# Patient Record
Sex: Female | Born: 1955 | Race: White | Hispanic: No | Marital: Married | Smoking: Never smoker
Health system: Southern US, Community
[De-identification: ages and names within clinical notes are randomized; demographics above are authoritative.]

## PROBLEM LIST (undated history)

## (undated) DIAGNOSIS — H269 Unspecified cataract: Secondary | ICD-10-CM

## (undated) DIAGNOSIS — N83209 Unspecified ovarian cyst, unspecified side: Secondary | ICD-10-CM

## (undated) DIAGNOSIS — C44711 Basal cell carcinoma of skin of unspecified lower limb, including hip: Secondary | ICD-10-CM

## (undated) DIAGNOSIS — I7300 Raynaud's syndrome without gangrene: Secondary | ICD-10-CM

## (undated) DIAGNOSIS — M19042 Primary osteoarthritis, left hand: Secondary | ICD-10-CM

## (undated) DIAGNOSIS — N912 Amenorrhea, unspecified: Secondary | ICD-10-CM

## (undated) DIAGNOSIS — I8392 Asymptomatic varicose veins of left lower extremity: Secondary | ICD-10-CM

## (undated) DIAGNOSIS — M779 Enthesopathy, unspecified: Secondary | ICD-10-CM

## (undated) DIAGNOSIS — M19041 Primary osteoarthritis, right hand: Secondary | ICD-10-CM

## (undated) DIAGNOSIS — C801 Malignant (primary) neoplasm, unspecified: Secondary | ICD-10-CM

## (undated) HISTORY — DX: Primary osteoarthritis, left hand: M19.042

## (undated) HISTORY — DX: Asymptomatic varicose veins of left lower extremity: I83.92

## (undated) HISTORY — PX: SCLEROTHERAPY: SHX6841

## (undated) HISTORY — PX: CATARACT EXTRACTION: SUR2

## (undated) HISTORY — PX: VEIN SURGERY: SHX48

## (undated) HISTORY — DX: Primary osteoarthritis, right hand: M19.041

## (undated) HISTORY — PX: EYE SURGERY: SHX253

## (undated) HISTORY — PX: BASAL CELL CARCINOMA EXCISION: SHX1214

## (undated) HISTORY — PX: FINGER SURGERY: SHX640

## (undated) HISTORY — PX: EXCISION NEUROMA: SHX6350

## (undated) HISTORY — PX: BARTHOLIN CYST MARSUPIALIZATION: SHX5383

## (undated) HISTORY — PX: OTHER SURGICAL HISTORY: SHX169

## (undated) HISTORY — DX: Unspecified ovarian cyst, unspecified side: N83.209

## (undated) HISTORY — PX: HERNIA REPAIR: SHX51

## (undated) HISTORY — PX: WISDOM TOOTH EXTRACTION: SHX21

## (undated) HISTORY — PX: THYROID LOBECTOMY: SHX420

---

## 2009-02-06 HISTORY — PX: OTHER SURGICAL HISTORY: SHX169

## 2011-03-06 LAB — HM DEXA SCAN: HM Dexa Scan: NORMAL

## 2013-12-12 ENCOUNTER — Ambulatory Visit: Payer: Self-pay | Admitting: Ophthalmology

## 2014-01-30 ENCOUNTER — Ambulatory Visit: Payer: Self-pay | Admitting: Ophthalmology

## 2014-02-07 ENCOUNTER — Ambulatory Visit: Payer: Self-pay | Admitting: Family Medicine

## 2014-09-02 NOTE — Op Note (Signed)
PATIENT NAME:  Mia Cruz, Mia Cruz MR#:  [id number] DATE OF BIRTH:  [birth year]  DATE OF PROCEDURE:  12/12/2013  PREOPERATIVE DIAGNOSIS: Cataract, left eye.   POSTOPERATIVE DIAGNOSIS: Cataract, left eye.   PROCEDURE PERFORMED: Extracapsular cataract extraction using phacoemulsification with placement of Alcon SN6CWS 18.5 diopter posterior chamber lens, serial number [id number].   ANESTHESIA: 4% lidocaine and 0.75% Marcaine a 50-50 mixture with 10 units/mL of HyoMax added, given as a peribulbar.   ANESTHESIOLOGIST: Dr. Boston Service.   COMPLICATIONS: None.   ESTIMATED BLOOD LOSS: Less than 1 mL.   DESCRIPTION OF PROCEDURE: The patient was brought to the operating room and given a peribulbar block.  The patient was then prepped and draped in the usual fashion.  The vertical rectus muscles were imbricated using 5-0 silk sutures.  These sutures were then clamped to the sterile drapes as bridle sutures.  A limbal peritomy was performed extending two clock hours and hemostasis was obtained with cautery.  A partial thickness scleral groove was made at the surgical limbus and dissected anteriorly in a lamellar dissection using an Alcon crescent knife.  The anterior chamber was entered supero-temporally with a Superblade and through the lamellar dissection with a 2.6 mm keratome.  DisCoVisc was used to replace the aqueous and a continuous tear capsulorrhexis was carried out.  Hydrodissection and hydrodelineation were carried out with balanced salt and a 27 gauge canula.  The nucleus was rotated to confirm the effectiveness of the hydrodissection.  Phacoemulsification was carried out using a divide-and-conquer technique.  Total ultrasound time was 1 minute 57 seconds with an average power of 27.8 percent. CDE 52.62. No suture was placed.   Irrigation/aspiration was used to remove the residual cortex.  DisCoVisc was used to inflate the capsule and the internal incision was enlarged to 3 mm with the crescent  knife.  The intraocular lens was folded and inserted into the capsular bag using the Alcon AcrySert delivery system.  Irrigation/aspiration was used to remove the residual DisCoVisc.  Miostat was injected into the anterior chamber through the paracentesis track to inflate the anterior chamber and induce miosis.  The wound was checked for leaks and none were found. The conjunctiva was closed with cautery and the bridle sutures were removed.  One-tenth of a milliliter of Vigamox containing 0.1 mg of drug was injected via the paracentesis tract. An eye shield was placed on the eye.  The patient was discharged to the recovery room in good condition.   ____________________________ Loura Back Dandra Velardi, MD sad:lt D: 12/12/2013 13:42:45 ET T: 12/12/2013 16:57:04 ET JOB#: [id number]  cc: Remo Lipps A. Masiya Claassen, MD, <Dictator> Martie Lee MD ELECTRONICALLY SIGNED 12/19/2013 13:03

## 2014-09-02 NOTE — Op Note (Signed)
PATIENT NAME:  Mia Cruz, Mia Cruz MR#:  [id number] DATE OF BIRTH:  [birth year]  DATE OF PROCEDURE:  01/30/2014  PREOPERATIVE DIAGNOSIS:  Cataract, right eye.   POSTOPERATIVE DIAGNOSIS:  Cataract, right eye.  PROCEDURE PERFORMED:  Extracapsular cataract extraction using phacoemulsification with placement of an Alcon SN6CWS, 18.5-diopter posterior chamber lens, serial [id number].  SURGEON:  Loura Back. Caliana Spires, MD  ASSISTANT:  None.  ANESTHESIA:  4% lidocaine and 0.75% Marcaine in a 50/50 mixture with 10 units/mL of Hylenex added, given as a peribulbar.   ANESTHESIOLOGIST:  Dr. Benjamine Mola  COMPLICATIONS:  None.  ESTIMATED BLOOD LOSS:  Less than 1 ml.  DESCRIPTION OF PROCEDURE:  The patient was brought to the operating room and given a peribulbar block.  The patient was then prepped and draped in the usual fashion.  The vertical rectus muscles were imbricated using 5-0 silk sutures.  These sutures were then clamped to the sterile drapes as bridle sutures.  A limbal peritomy was performed extending two clock hours and hemostasis was obtained with cautery.  A partial thickness scleral groove was made at the surgical limbus and dissected anteriorly in a lamellar dissection using an Alcon crescent knife.  The anterior chamber was entered superonasally with a Superblade and through the lamellar dissection with a 2.6 mm keratome.  DisCoVisc was used to replace the aqueous and a continuous tear capsulorrhexis was carried out.  Hydrodissection and hydrodelineation were carried out with balanced salt and a 27 gauge canula.  The nucleus was rotated to confirm the effectiveness of the hydrodissection.  Phacoemulsification was carried out using a divide-and-conquer technique.  Total ultrasound time was 1 minute and 44 seconds with an average power of 26.7 percent and CDE 48.66.  Irrigation/aspiration was used to remove the residual cortex.  DisCoVisc was used to inflate the capsule and the internal incision was  enlarged to 3 mm with the crescent knife.  The intraocular lens was folded and inserted into the capsular bag using the AcrySert delivery system. Irrigation/aspiration was used to remove the residual DisCoVisc.  Miostat was injected into the anterior chamber through the paracentesis track to inflate the anterior chamber and induce miosis. A tenth of a milliliter of Vigamox containing 0.1 mg of drug was injected via the paracentesis tract. The wound was checked for leaks and none were found. The conjunctiva was closed with cautery and the bridle sutures were removed.  Two drops of 0.3% Vigamox were placed on the eye.   An eye shield was placed on the eye.  The patient was discharged to the recovery room in good condition.   ____________________________ Loura Back Shamiah Kahler, MD sad:sb D: 01/30/2014 12:59:19 ET T: 01/30/2014 13:15:52 ET JOB#: [id number]  cc: Remo Lipps A. Bell Cai, MD, <Dictator> Martie Lee MD ELECTRONICALLY SIGNED 01/30/2014 14:00

## 2014-11-14 ENCOUNTER — Ambulatory Visit (INDEPENDENT_AMBULATORY_CARE_PROVIDER_SITE_OTHER): Payer: 59

## 2014-11-14 ENCOUNTER — Ambulatory Visit (INDEPENDENT_AMBULATORY_CARE_PROVIDER_SITE_OTHER): Payer: 59 | Admitting: Podiatry

## 2014-11-14 ENCOUNTER — Encounter: Payer: Self-pay | Admitting: Podiatry

## 2014-11-14 VITALS — BP 133/73 | HR 88 | Resp 16

## 2014-11-14 DIAGNOSIS — M7662 Achilles tendinitis, left leg: Secondary | ICD-10-CM

## 2014-11-14 DIAGNOSIS — M7661 Achilles tendinitis, right leg: Secondary | ICD-10-CM | POA: Diagnosis not present

## 2014-11-14 DIAGNOSIS — M79673 Pain in unspecified foot: Secondary | ICD-10-CM | POA: Diagnosis not present

## 2014-11-14 MED ORDER — METHYLPREDNISOLONE 4 MG PO TBPK: ORAL_TABLET | ORAL | Status: DC

## 2014-11-14 MED ORDER — DICLOFENAC SODIUM 1 % TD GEL: 4.0000 g | Freq: Four times a day (QID) | TRANSDERMAL | Status: DC

## 2014-11-14 NOTE — Progress Notes (Signed)
   Subjective:    Patient ID: Mia Cruz, female    DOB: Sep 02, 1955, 59 y.o.   MRN: [id number]  HPI Comments: "I have pain in the achilles tendon"  Patient c/o aching posterior heel/achilles tendon area bilateral, right over left, for several months. Pain AM. Swelling. Active hiker. Takes meloxicam for thumbs, wears orthotics. Getting worse.   Foot Pain Associated symptoms include arthralgias.      Review of Systems  Musculoskeletal: Positive for arthralgias.  All other systems reviewed and are negative.      Objective:   Physical Exam: I have reviewed his past mental history medications allergy surgery social history. Pulses are palpable neurologic sensorium is intact. Deep tendon reflex is intact. Muscle strength is normal bilateral. Orthopedic evaluation demonstrates all joints distal to the ankle for range of motion without crepitation. She has pain on palpation posterior aspect of the bilateral heel. Marginal pulsatile masses posterior aspect of the calcaneus indicative of retrocalcaneal heel spur. Radiograph confirms this with thickening of the Achilles tendon.        Assessment & Plan:  Assessment: Achilles tendinitis bilateral   Plan: Start her on a Medrol Dosepak and then continue with her meloxicam prescription. Dispensed night splint and discussed the possibility of orthotics. Follow up within 1 month.

## 2014-11-14 NOTE — Patient Instructions (Signed)

## 2014-11-30 ENCOUNTER — Ambulatory Visit: Payer: 59 | Admitting: Podiatry

## 2014-12-21 ENCOUNTER — Ambulatory Visit: Payer: 59 | Admitting: Podiatry

## 2014-12-27 HISTORY — PX: BARTHOLIN CYST MARSUPIALIZATION: SHX5383

## 2015-05-24 ENCOUNTER — Other Ambulatory Visit: Payer: Self-pay | Admitting: Obstetrics and Gynecology

## 2015-05-24 DIAGNOSIS — Z1231 Encounter for screening mammogram for malignant neoplasm of breast: Secondary | ICD-10-CM

## 2015-06-05 ENCOUNTER — Ambulatory Visit
Admission: RE | Admit: 2015-06-05 | Discharge: 2015-06-05 | Disposition: A | Discharge status: Home / Self Care | Payer: 59 | Source: Ambulatory Visit | Attending: Obstetrics and Gynecology | Admitting: Obstetrics and Gynecology

## 2015-06-05 DIAGNOSIS — Z1231 Encounter for screening mammogram for malignant neoplasm of breast: Secondary | ICD-10-CM | POA: Diagnosis not present

## 2015-06-05 HISTORY — DX: Malignant (primary) neoplasm, unspecified: C80.1

## 2016-07-03 ENCOUNTER — Other Ambulatory Visit: Payer: Self-pay | Admitting: Obstetrics and Gynecology

## 2016-07-03 DIAGNOSIS — Z1231 Encounter for screening mammogram for malignant neoplasm of breast: Secondary | ICD-10-CM

## 2016-07-30 ENCOUNTER — Ambulatory Visit
Admission: RE | Admit: 2016-07-30 | Discharge: 2016-07-30 | Disposition: A | Discharge status: Home / Self Care | Payer: 59 | Source: Ambulatory Visit | Attending: Obstetrics and Gynecology | Admitting: Obstetrics and Gynecology

## 2016-07-30 DIAGNOSIS — Z1231 Encounter for screening mammogram for malignant neoplasm of breast: Secondary | ICD-10-CM | POA: Diagnosis not present

## 2016-08-19 ENCOUNTER — Encounter: Payer: Self-pay | Admitting: *Deleted

## 2016-08-20 ENCOUNTER — Encounter: Payer: Self-pay | Admitting: *Deleted

## 2016-08-20 ENCOUNTER — Ambulatory Visit: Payer: 59 | Admitting: Anesthesiology

## 2016-08-20 ENCOUNTER — Encounter
Admission: RE | Disposition: A | Discharge status: Home / Self Care | Payer: Self-pay | Source: Ambulatory Visit | Attending: Unknown Physician Specialty

## 2016-08-20 ENCOUNTER — Ambulatory Visit
Admission: RE | Admit: 2016-08-20 | Discharge: 2016-08-20 | Disposition: A | Discharge status: Home / Self Care | Payer: 59 | Source: Ambulatory Visit | Attending: Unknown Physician Specialty | Admitting: Unknown Physician Specialty

## 2016-08-20 DIAGNOSIS — Z1211 Encounter for screening for malignant neoplasm of colon: Secondary | ICD-10-CM | POA: Insufficient documentation

## 2016-08-20 DIAGNOSIS — K640 First degree hemorrhoids: Secondary | ICD-10-CM | POA: Diagnosis not present

## 2016-08-20 DIAGNOSIS — Z79899 Other long term (current) drug therapy: Secondary | ICD-10-CM | POA: Diagnosis not present

## 2016-08-20 DIAGNOSIS — Z85828 Personal history of other malignant neoplasm of skin: Secondary | ICD-10-CM | POA: Diagnosis not present

## 2016-08-20 DIAGNOSIS — I7300 Raynaud's syndrome without gangrene: Secondary | ICD-10-CM | POA: Diagnosis not present

## 2016-08-20 DIAGNOSIS — D128 Benign neoplasm of rectum: Secondary | ICD-10-CM | POA: Insufficient documentation

## 2016-08-20 DIAGNOSIS — Z791 Long term (current) use of non-steroidal anti-inflammatories (NSAID): Secondary | ICD-10-CM | POA: Insufficient documentation

## 2016-08-20 HISTORY — DX: Amenorrhea, unspecified: N91.2

## 2016-08-20 HISTORY — DX: Raynaud's syndrome without gangrene: I73.00

## 2016-08-20 HISTORY — DX: Unspecified cataract: H26.9

## 2016-08-20 HISTORY — DX: Enthesopathy, unspecified: M77.9

## 2016-08-20 HISTORY — DX: Basal cell carcinoma of skin of unspecified lower limb, including hip: C44.711

## 2016-08-20 LAB — HM COLONOSCOPY

## 2016-08-20 SURGERY — COLONOSCOPY WITH PROPOFOL
Anesthesia: General

## 2016-08-20 MED ORDER — MIDAZOLAM HCL 5 MG/5ML IJ SOLN
INTRAMUSCULAR | Status: DC | PRN
  Administered 2016-08-20 (×2): 1 mg via INTRAVENOUS

## 2016-08-20 MED ORDER — FENTANYL CITRATE (PF) 100 MCG/2ML IJ SOLN
INTRAMUSCULAR | Status: DC | PRN
  Administered 2016-08-20 (×2): 50 ug via INTRAVENOUS

## 2016-08-20 MED ORDER — PROPOFOL 500 MG/50ML IV EMUL
INTRAVENOUS | Status: AC
  Filled 2016-08-20: qty 50

## 2016-08-20 MED ORDER — PHENYLEPHRINE HCL 10 MG/ML IJ SOLN
INTRAMUSCULAR | Status: DC | PRN
  Administered 2016-08-20: 100 ug via INTRAVENOUS

## 2016-08-20 MED ORDER — FENTANYL CITRATE (PF) 100 MCG/2ML IJ SOLN
INTRAMUSCULAR | Status: AC
Start: 2016-08-20 — End: 2016-08-20
  Filled 2016-08-20: qty 2

## 2016-08-20 MED ORDER — MIDAZOLAM HCL 2 MG/2ML IJ SOLN
INTRAMUSCULAR | Status: AC
  Filled 2016-08-20: qty 2

## 2016-08-20 MED ORDER — SODIUM CHLORIDE 0.9 % IV SOLN
INTRAVENOUS | Status: DC
  Administered 2016-08-20: 10:00:00 via INTRAVENOUS

## 2016-08-20 MED ORDER — PROPOFOL 500 MG/50ML IV EMUL
INTRAVENOUS | Status: DC | PRN
  Administered 2016-08-20: 150 ug/kg/min via INTRAVENOUS

## 2016-08-20 MED ORDER — PROPOFOL 10 MG/ML IV BOLUS
INTRAVENOUS | Status: DC | PRN
  Administered 2016-08-20: 100 mg via INTRAVENOUS

## 2016-08-20 MED ORDER — LIDOCAINE 2% (20 MG/ML) 5 ML SYRINGE
INTRAMUSCULAR | Status: DC | PRN
  Administered 2016-08-20: 30 mg via INTRAVENOUS

## 2016-08-20 NOTE — Op Note (Signed)
[facility] Gastroenterology Patient Name: Mia Cruz Procedure Date: 08/20/2016 9:45 AM MRN: [id number] Account #: [id number] Date of Birth: January 01, 1956 Admit Type: Outpatient Age: 61 Room: [facility] ENDO ROOM 3 Gender: Female Note Status: Finalized Procedure:            Colonoscopy Indications:          Screening for colorectal malignant neoplasm Providers:            Manya Silvas, MD Referring MD:         Irven Easterly. Kary Kos, MD (Referring MD) Medicines:            Propofol per Anesthesia Complications:        No immediate complications. Procedure:            Pre-Anesthesia Assessment:                       - After reviewing the risks and benefits, the patient                        was deemed in satisfactory condition to undergo the                        procedure.                       After obtaining informed consent, the colonoscope was                        passed under direct vision. Throughout the procedure,                        the patient's blood pressure, pulse, and oxygen                        saturations were monitored continuously. The                        Colonoscope was introduced through the anus and                        advanced to the the cecum, identified by appendiceal                        orifice and ileocecal valve. The colonoscopy was                        performed without difficulty. The patient tolerated the                        procedure well. The quality of the bowel preparation                        was excellent. Findings:      A small polyp was found in the rectum. The polyp was sessile. The polyp       was partly removed with a removed with a cold snare. A piece remained       and was removed with a jumbo forceps. Some bleeding occured so I applied       a clip to close the edges of the polypectomy site. Resection and  retrieval were complete.      Internal hemorrhoids were found during endoscopy. The hemorrhoids  were       medium-sized and Grade I (internal hemorrhoids that do not prolapse).      The remainder of the colon exam was normal, the colon was somewhat long       and redundant. Impression:           - One small polyp in the rectum, removed with a hot                        snare. Resected and retrieved.                       - Internal hemorrhoids. Recommendation:       - Await pathology results. The clip will slough off in                        1-2 weeks in your stool. Manya Silvas, MD 08/20/2016 10:42:13 AM This report has been signed electronically. Number of Addenda: 0 Note Initiated On: 08/20/2016 9:45 AM Scope Withdrawal Time: 0 hours 19 minutes 48 seconds  Total Procedure Duration: 0 hours 31 minutes 14 seconds       [facility]

## 2016-08-20 NOTE — Anesthesia Postprocedure Evaluation (Signed)
Anesthesia Post Note  Patient: Mia Cruz  Procedure(s) Performed: Procedure(s) (LRB): COLONOSCOPY WITH PROPOFOL (N/A)  Patient location during evaluation: Endoscopy Anesthesia Type: General Level of consciousness: awake and alert Pain management: pain level controlled Vital Signs Assessment: post-procedure vital signs reviewed and stable Respiratory status: spontaneous breathing, nonlabored ventilation, respiratory function stable and patient connected to nasal cannula oxygen Cardiovascular status: blood pressure returned to baseline and stable Postop Assessment: no signs of nausea or vomiting Anesthetic complications: no     Last Vitals:  Vitals:   08/20/16 1100 08/20/16 1110  BP: 102/66 123/76  Pulse: (!) 53 (!) 52  Resp: 14 18  Temp:  36.3 C    Last Pain:  Vitals:   08/20/16 1110  TempSrc:   PainSc: 0-No pain                 Martha Clan

## 2016-08-20 NOTE — Anesthesia Post-op Follow-up Note (Cosign Needed)
Anesthesia QCDR form completed.        

## 2016-08-20 NOTE — Anesthesia Preprocedure Evaluation (Signed)
Anesthesia Evaluation  Patient identified by MRN, date of birth, ID band Patient awake    Reviewed: Allergy & Precautions, H&P , NPO status , Patient's Chart, lab work & pertinent test results, reviewed documented beta blocker date and time   History of Anesthesia Complications Negative for: history of anesthetic complications  Airway Mallampati: I  TM Distance: >3 FB Neck ROM: full    Dental  (+) Caps   Pulmonary neg pulmonary ROS,           Cardiovascular Exercise Tolerance: Good negative cardio ROS       Neuro/Psych negative neurological ROS  negative psych ROS   GI/Hepatic negative GI ROS, Neg liver ROS,   Endo/Other  negative endocrine ROS  Renal/GU negative Renal ROS  negative genitourinary   Musculoskeletal   Abdominal   Peds  Hematology negative hematology ROS (+)   Anesthesia Other Findings Past Medical History: No date: Amenorrhea No date: BCC (basal cell carcinoma), leg No date: Cancer ([place])     Comment: skin No date: Cataract No date: Raynaud disease No date: Tendonitis   Reproductive/Obstetrics negative OB ROS                             Anesthesia Physical Anesthesia Plan  ASA: II  Anesthesia Plan: General   Post-op Pain Management:    Induction:   Airway Management Planned:   Additional Equipment:   Intra-op Plan:   Post-operative Plan:   Informed Consent: I have reviewed the patients History and Physical, chart, labs and discussed the procedure including the risks, benefits and alternatives for the proposed anesthesia with the patient or authorized representative who has indicated his/her understanding and acceptance.   Dental Advisory Given  Plan Discussed with: Anesthesiologist, CRNA and Surgeon  Anesthesia Plan Comments:         Anesthesia Quick Evaluation

## 2016-08-20 NOTE — H&P (Signed)
Primary Care Physician:  [place] Pink, MD Primary Gastroenterologist:  Dr. Vira Agar  Pre-Procedure History & Physical: HPI:  Mia Cruz is a 61 y.o. female is here for an colonoscopy.   Past Medical History:  Diagnosis Date  . Amenorrhea   . BCC (basal cell carcinoma), leg   . Cancer ([place])    skin  . Cataract   . Raynaud disease   . Tendonitis     Past Surgical History:  Procedure Laterality Date  . CATARACT EXTRACTION    . EXCISION NEUROMA    . EYE SURGERY    . HERNIA REPAIR    . marsupization    . THYROID LOBECTOMY    . VEIN SURGERY      Prior to Admission medications   Medication Sig Start Date End Date Taking? Authorizing Provider  Cholecalciferol (VITAMIN D PO) Take by mouth.   Yes Historical Provider, MD  GRAPE SEED EXTRACT PO Take by mouth.   Yes Historical Provider, MD  meloxicam (MOBIC) 7.5 MG tablet TAKE 1 TABLET (7.5 MG TOTAL) BY MOUTH ONCE DAILY. 11/05/14  Yes Historical Provider, MD  Multiple Vitamin (MULTIVITAMIN) capsule Take 1 capsule by mouth daily.   Yes Historical Provider, MD  valACYclovir (VALTREX) 1000 MG tablet Take 1,000 mg by mouth 2 (two) times daily.   Yes Historical Provider, MD  diclofenac sodium (VOLTAREN) 1 % GEL Apply 4 g topically 4 (four) times daily. Patient not taking: Reported on 08/20/2016 11/14/14   Max T Hyatt, DPM  glucosamine-chondroitin 500-400 MG tablet Take 1 tablet by mouth 3 (three) times daily.    Historical Provider, MD  methylPREDNISolone (MEDROL) 4 MG TBPK tablet Tapering 6 day dose pack Patient not taking: Reported on 08/20/2016 11/14/14   Max T Hyatt, DPM    Allergies as of 07/03/2016 - Review Complete 11/14/2014  Allergen Reaction Noted  . Penicillins Anaphylaxis 11/14/2014    Family History  Problem Relation Age of Onset  . Breast cancer Maternal Aunt     50's    Social History   Social History  . Marital status: Married    Spouse name: N/A  . Number of children: N/A  . Years of education: N/A    Occupational History  . Not on file.   Social History Main Topics  . Smoking status: Never Smoker  . Smokeless tobacco: Never Used  . Alcohol use 0.0 oz/week     Comment: wine  . Drug use: No  . Sexual activity: Not on file   Other Topics Concern  . Not on file   Social History Narrative  . No narrative on file    Review of Systems: See HPI, otherwise negative ROS  Physical Exam: BP 130/73   Pulse 79   Temp (!) 96.4 F (35.8 C) (Tympanic)   Resp 18   LMP 10/11/1998   SpO2 (!) 71%  General:   Alert,  pleasant and cooperative in NAD Head:  Normocephalic and atraumatic. Neck:  Supple; no masses or thyromegaly. Lungs:  Clear throughout to auscultation.    Heart:  Regular rate and rhythm. Abdomen:  Soft, nontender and nondistended. Normal bowel sounds, without guarding, and without rebound.   Neurologic:  Alert and  oriented x4;  grossly normal neurologically.  Impression/Plan: Mia Cruz is here for an colonoscopy to be performed for screening colonoscopy  Risks, benefits, limitations, and alternatives regarding  colonoscopy have been reviewed with the patient.  Questions have been answered.  All parties agreeable.   Krisna Omar,  MD  08/20/2016, 9:52 AM

## 2016-08-20 NOTE — Transfer of Care (Signed)
Immediate Anesthesia Transfer of Care Note  Patient: Mia Cruz  Procedure(s) Performed: Procedure(s): COLONOSCOPY WITH PROPOFOL (N/A)  Patient Location: PACU and Endoscopy Unit  Anesthesia Type:General  Level of Consciousness: awake, oriented and patient cooperative  Airway & Oxygen Therapy: Patient Spontanous Breathing and Patient connected to nasal cannula oxygen  Post-op Assessment: Report given to RN and Post -op Vital signs reviewed and stable  Post vital signs: Reviewed and stable  Last Vitals:  Vitals:   08/20/16 0906  BP: 130/73  Pulse: 79  Resp: 18  Temp: (!) 35.8 C    Last Pain:  Vitals:   08/20/16 0906  TempSrc: Tympanic         Complications: No apparent anesthesia complications

## 2016-08-21 LAB — SURGICAL PATHOLOGY

## 2017-05-22 ENCOUNTER — Encounter: Payer: Self-pay | Admitting: Occupational Therapy

## 2017-05-22 ENCOUNTER — Ambulatory Visit: Payer: 59 | Attending: Rheumatology | Admitting: Occupational Therapy

## 2017-05-22 ENCOUNTER — Other Ambulatory Visit: Payer: Self-pay

## 2017-05-22 DIAGNOSIS — M25641 Stiffness of right hand, not elsewhere classified: Secondary | ICD-10-CM | POA: Diagnosis present

## 2017-05-22 DIAGNOSIS — M25642 Stiffness of left hand, not elsewhere classified: Secondary | ICD-10-CM | POA: Diagnosis present

## 2017-05-22 DIAGNOSIS — M79641 Pain in right hand: Secondary | ICD-10-CM

## 2017-05-22 DIAGNOSIS — M79642 Pain in left hand: Secondary | ICD-10-CM | POA: Diagnosis present

## 2017-05-22 NOTE — Therapy (Signed)
[facility] [address] [place], [address] Phone: [phone]   Fax:  [phone]  Occupational Therapy Evaluation  Patient Details  Name: Mia Cruz MRN: [id number] Date of Birth: 02/13/1956 Referring Provider: Jefm Bryant    Encounter Date: 05/22/2017  OT End of Session - 05/22/17 2052    Visit Number  1    Number of Visits  3    Date for OT Re-Evaluation  07/03/17    OT Start Time  1105    OT Stop Time  1210    OT Time Calculation (min)  65 min    Activity Tolerance  Patient tolerated treatment well    Behavior During Therapy  [facility] for tasks assessed/performed       Past Medical History:  Diagnosis Date  . Amenorrhea   . BCC (basal cell carcinoma), leg   . Cancer ([place])    skin  . Cataract   . Raynaud disease   . Tendonitis     Past Surgical History:  Procedure Laterality Date  . CATARACT EXTRACTION    . EXCISION NEUROMA    . EYE SURGERY    . HERNIA REPAIR    . marsupization    . THYROID LOBECTOMY    . VEIN SURGERY      There were no vitals filed for this visit.  Subjective Assessment - 05/22/17 2042    Subjective   My hands been bothering me more and more - had injury to my R thumb about 8 yrs ago , and my L hand bothers me more than the R - pain over the fingers and harder time to do yoga, lift or carry things, type on computer, cut food , yard work , sew and quilt     Patient Stated Goals  I just do not my hands to get the worse , the pain , the joints, using them  - if you can tell me what to do to maintain my range and strength     Currently in Pain?  Yes    Pain Score  2     Pain Location  Hand    Pain Orientation  Right;Left    Pain Descriptors / Indicators  Aching    Pain Type  Chronic pain    Pain Onset  More than a month ago        [facility] OT Assessment - 05/22/17 0001      Assessment   Medical Diagnosis  bilateral hand pain , OA     Referring Provider  kernodle     Onset  Date/Surgical Date  -- gradually the last 2-3 yrs     Hand Dominance  Right      Home  Environment   Lives With  Spouse      Prior Function   Vocation  Full time employment RN     Leisure  R hand dominant, likes to quilt, sew, yard work , yoga, hike, tablet and bake       Strength   Right Hand Grip (lbs)  45 55 cmc neoprene splint     Right Hand Lateral Pinch  12 lbs    Right Hand 3 Point Pinch  14 lbs    Left Hand Grip (lbs)  45 58 with cmc neoprene splint     Left Hand Lateral Pinch  11 lbs    Left Hand 3 Point Pinch  15 lbs      Right Hand  AROM   R Thumb Radial ABduction/ADduction 0-55  55    R Thumb Palmar ABduction/ADduction 0-45  40    R Index  MCP 0-90  80 Degrees      Left Hand AROM   L Thumb Radial ADduction/ABduction 0-55  40    L Thumb Palmar ADduction/ABduction 0-45  40      Paraffin done to bilateral hands prior to review of HEP to decrease pain with great results for pain per pt   HEP review  Moist heat Tendon glides Opposition  Thumb PA and RA - keep thumb joints straight - no hyper extention at MP  Joint protection principles  AE   hand out provided   pt fitted with neoprene CMC splint - issue on for L hand - pt report has one for R but did not use it  Ed pt on using it and show that she had less pain and increase grip with it on                   OT Education - 05/22/17 2052    Education provided  Yes    Education Details  Findings ,HEP and joint protection - splint use     Person(s) Educated  Patient    Methods  Explanation;Demonstration;Tactile cues;Verbal cues;Handout    Comprehension  Verbal cues required;Returned demonstration;Verbalized understanding          OT Long Term Goals - 05/22/17 2058      OT LONG TERM GOAL #1   Title  Pain decrease by 15 points on PRWHE     Baseline  PRWHE for pain at eval 27/50     Time  3    Period  Weeks    Status  New    Target Date  06/12/17      OT LONG TERM GOAL #2   Title  Pt  report and demo 3 joint protection and AE to decrease pain and maintain ROM /strength in hands     Baseline  no knowledge - was no using her R cmc neoprene splint     Time  6    Period  Weeks    Status  New    Target Date  07/03/17            Plan - 05/22/17 2053    Clinical Impression Statement  Pt present at OT evaluation with bilateral hand pain - and OA - pt had old thumb injury to R thumb and then report that L hand is actually hurting more - deep ache over R thumb and fingers of L hand - pt show hyperextention of DIP and decrease DIP flexion during oppoistion in R hand more than L , only R 2nd digit MC decrease - thumb CMC ADD and pt compensate with hyper extention of MP and flexion of IP during loading act and hyper extention of MP during ABD - pt increase pain with use  - grip WFL - and with CMC neoeprene splint pt actually on higher range for her age - pt can beneifit from Education to marntain ROM , and strength - decrease pain and modifications to protect joints     Occupational performance deficits (Please refer to evaluation for details):  ADL's;IADL's;Play;Leisure;Work    Rehab Potential  Good    OT Frequency  1x / week every 3 wks     OT Duration  6 weeks 3 wks     OT Treatment/Interventions  Self-care/ADL training;DME and/or  AE instruction;Splinting;Therapeutic exercise;Paraffin;Manual Therapy    Plan  assess progress with joint protection , AE and HEP     Clinical Decision Making  Limited treatment options, no task modification necessary    OT Home Exercise Plan  see pt instruction     Consulted and Agree with Plan of Care  Patient       Patient will benefit from skilled therapeutic intervention in order to improve the following deficits and impairments:  Decreased knowledge of use of DME, Impaired flexibility, Pain, Decreased range of motion, Impaired UE functional use, Decreased strength  Visit Diagnosis: Pain in left hand - Plan: Ot plan of care cert/re-cert  Pain  in right hand - Plan: Ot plan of care cert/re-cert  Stiffness of left hand, not elsewhere classified - Plan: Ot plan of care cert/re-cert  Stiffness of right hand, not elsewhere classified - Plan: Ot plan of care cert/re-cert    Problem List There are no active problems to display for this patient.   Rosalyn Gess OTR/L,CLT 05/22/2017, 9:03 PM  [facility] [address], [place], [address] Phone: [phone]   Fax:  [phone]  Name: Mia Cruz MRN: [id number] Date of Birth: [birth year]

## 2017-05-22 NOTE — Patient Instructions (Signed)
Moist heat Tendon glides Opposition  Thumb PA and RA - keep thumb joints straight - no hyper extention at MP  Joint protection principles  AE   hand out provided   pt fitted with neoprene CMC splint - issue on for L hand - pt report has one for R but did not use it  Ed pt on using it and show that she had less pain and increase grip with it on

## 2017-07-20 ENCOUNTER — Other Ambulatory Visit: Payer: Self-pay | Admitting: Obstetrics and Gynecology

## 2017-07-20 DIAGNOSIS — Z1231 Encounter for screening mammogram for malignant neoplasm of breast: Secondary | ICD-10-CM

## 2017-07-20 LAB — HM PAP SMEAR

## 2017-07-31 ENCOUNTER — Ambulatory Visit
Admission: RE | Admit: 2017-07-31 | Discharge: 2017-07-31 | Disposition: A | Discharge status: Home / Self Care | Payer: 59 | Source: Ambulatory Visit | Attending: Obstetrics and Gynecology | Admitting: Obstetrics and Gynecology

## 2017-07-31 DIAGNOSIS — Z1231 Encounter for screening mammogram for malignant neoplasm of breast: Secondary | ICD-10-CM | POA: Diagnosis not present

## 2019-05-10 ENCOUNTER — Other Ambulatory Visit: Payer: Self-pay | Admitting: Family Medicine

## 2019-05-10 DIAGNOSIS — Z1231 Encounter for screening mammogram for malignant neoplasm of breast: Secondary | ICD-10-CM

## 2019-05-12 ENCOUNTER — Ambulatory Visit
Admission: RE | Admit: 2019-05-12 | Discharge: 2019-05-12 | Disposition: A | Discharge status: Home / Self Care | Payer: 59 | Source: Ambulatory Visit | Attending: Family Medicine | Admitting: Family Medicine

## 2019-05-12 DIAGNOSIS — Z1231 Encounter for screening mammogram for malignant neoplasm of breast: Secondary | ICD-10-CM | POA: Diagnosis not present

## 2019-05-12 LAB — HM MAMMOGRAPHY

## 2019-06-16 DIAGNOSIS — N83202 Unspecified ovarian cyst, left side: Secondary | ICD-10-CM | POA: Insufficient documentation

## 2019-06-16 DIAGNOSIS — Z8742 Personal history of other diseases of the female genital tract: Secondary | ICD-10-CM | POA: Insufficient documentation

## 2019-06-17 ENCOUNTER — Ambulatory Visit (INDEPENDENT_AMBULATORY_CARE_PROVIDER_SITE_OTHER): Payer: 59 | Admitting: Podiatry

## 2019-06-17 ENCOUNTER — Other Ambulatory Visit: Payer: Self-pay

## 2019-06-17 ENCOUNTER — Ambulatory Visit (INDEPENDENT_AMBULATORY_CARE_PROVIDER_SITE_OTHER): Payer: 59

## 2019-06-17 ENCOUNTER — Other Ambulatory Visit: Payer: Self-pay | Admitting: Podiatry

## 2019-06-17 DIAGNOSIS — M7672 Peroneal tendinitis, left leg: Secondary | ICD-10-CM

## 2019-06-17 DIAGNOSIS — M722 Plantar fascial fibromatosis: Secondary | ICD-10-CM

## 2019-06-17 DIAGNOSIS — G5761 Lesion of plantar nerve, right lower limb: Secondary | ICD-10-CM | POA: Diagnosis not present

## 2019-06-20 NOTE — Progress Notes (Signed)
   Subjective: 64 y.o. female presenting today as a new patient with a chief complaint of aching pain to the lateral aspects and arches of the bilateral feel that began about four months ago. She states she walks about three miles daily which makes the pain worse. She has been using heat therapy, has been taking Meloxicam and wearing orthotics (states they are 64 years old) for treatment. Patient is here for further evaluation and treatment.  Past Medical History:  Diagnosis Date  . Amenorrhea   . BCC (basal cell carcinoma), leg   . Cancer ([place])    skin  . Cataract   . Raynaud disease   . Tendonitis      Objective: Physical Exam General: The patient is alert and oriented x3 in no acute distress.  Dermatology: Skin is warm, dry and supple bilateral lower extremities. Negative for open lesions or macerations bilateral.   Vascular: Dorsalis Pedis and Posterior Tibial pulses palpable bilateral.  Capillary fill time is immediate to all digits.  Neurological: Epicritic and protective threshold intact bilateral.   Musculoskeletal: Tenderness to palpation to the plantar aspect of the bilateral heels along the plantar fascia as well as the insertion of the peroneal tendon sheath of the left foot. Sharp pain with palpation of the 4th right interspace and lateral compression of the metatarsal heads consistent with neuroma.  Positive Conley Canal sign with loadbearing of the forefoot. All other joints range of motion within normal limits bilateral. Strength 5/5 in all groups bilateral.   Radiographic exam: Normal osseous mineralization. Joint spaces preserved. No fracture/dislocation/boney destruction. No other soft tissue abnormalities or radiopaque foreign bodies.   Assessment: 1. plantar fasciitis bilateral feet 2. Insertional peroneal tendinitis left  3. Morton's Neuroma right 4th interspace   Plan of Care:  1. Patient evaluated. Xrays reviewed.   2. Appointment with Liliane Channel, Pedorthist, for  custom molded orthotics.  3. Increase Meloxicam to 15 mg daily.  4. Return to clinic as needed.     Edrick Kins, DPM [facility]  Dr. Edrick Kins, DPM    [address], [place] [address]                Office [phone]  Fax [phone]

## 2019-07-05 ENCOUNTER — Other Ambulatory Visit: Payer: Self-pay

## 2019-07-05 ENCOUNTER — Ambulatory Visit: Payer: 59 | Admitting: Orthotics

## 2019-07-05 DIAGNOSIS — M722 Plantar fascial fibromatosis: Secondary | ICD-10-CM

## 2019-07-05 DIAGNOSIS — M7672 Peroneal tendinitis, left leg: Secondary | ICD-10-CM

## 2019-07-05 DIAGNOSIS — G5761 Lesion of plantar nerve, right lower limb: Secondary | ICD-10-CM

## 2019-07-05 NOTE — Progress Notes (Signed)
Patient came in today to pick up custom made foot orthotics.  The goals were accomplished and the patient reported no dissatisfaction with said orthotics.  Patient was advised of breakin period and how to report any issues. 

## 2019-07-21 ENCOUNTER — Encounter: Payer: Self-pay | Admitting: Podiatry

## 2020-01-26 LAB — HEPATIC FUNCTION PANEL
ALT: 23 (ref 7–35)
AST: 26 (ref 13–35)
Alkaline Phosphatase: 66 (ref 25–125)
Bilirubin, Total: 0.6

## 2020-01-26 LAB — CBC AND DIFFERENTIAL
HCT: 39 (ref 36–46)
Hemoglobin: 12.7 (ref 12.0–16.0)
Platelets: 253 (ref 150–399)
WBC: 3.1

## 2020-01-26 LAB — CBC: RBC: 3.96 (ref 3.87–5.11)

## 2020-01-26 LAB — BASIC METABOLIC PANEL
BUN: 11 (ref 4–21)
CO2: 31 — AB (ref 13–22)
Chloride: 103 (ref 99–108)
Creatinine: 0.7 (ref 0.5–1.1)
Glucose: 76
Potassium: 4.3 (ref 3.4–5.3)
Sodium: 137 (ref 137–147)

## 2020-01-26 LAB — COMPREHENSIVE METABOLIC PANEL: Calcium: 9.1 (ref 8.7–10.7)

## 2020-03-19 ENCOUNTER — Other Ambulatory Visit: Payer: Self-pay

## 2020-03-19 ENCOUNTER — Encounter: Payer: Self-pay | Admitting: Internal Medicine

## 2020-03-19 ENCOUNTER — Ambulatory Visit (INDEPENDENT_AMBULATORY_CARE_PROVIDER_SITE_OTHER): Payer: Medicare Other | Admitting: Internal Medicine

## 2020-03-19 VITALS — BP 136/70 | HR 65 | Temp 97.7°F | Resp 16 | Ht 67.0 in | Wt 127.0 lb

## 2020-03-19 DIAGNOSIS — M1990 Unspecified osteoarthritis, unspecified site: Secondary | ICD-10-CM

## 2020-03-19 DIAGNOSIS — I83812 Varicose veins of left lower extremities with pain: Secondary | ICD-10-CM | POA: Diagnosis not present

## 2020-03-19 DIAGNOSIS — D128 Benign neoplasm of rectum: Secondary | ICD-10-CM

## 2020-03-19 DIAGNOSIS — R3915 Urgency of urination: Secondary | ICD-10-CM | POA: Insufficient documentation

## 2020-03-19 DIAGNOSIS — E890 Postprocedural hypothyroidism: Secondary | ICD-10-CM

## 2020-03-19 DIAGNOSIS — Z1159 Encounter for screening for other viral diseases: Secondary | ICD-10-CM

## 2020-03-19 DIAGNOSIS — N83202 Unspecified ovarian cyst, left side: Secondary | ICD-10-CM | POA: Diagnosis not present

## 2020-03-19 DIAGNOSIS — I7300 Raynaud's syndrome without gangrene: Secondary | ICD-10-CM

## 2020-03-19 NOTE — Progress Notes (Signed)
Provider:  Rexene Edison. Mariea Clonts, D.O., C.M.D. Location:   [place]   Place of Service:   clinic  Previous PCP: Gayland Curry, DO Patient Care Team: Gayland Curry, DO as PCP - General (Geriatric Medicine) Edrick Kins, DPM as Consulting Physician (Podiatry) Benjaman Kindler, MD as Consulting Physician (Obstetrics and Gynecology) Emmaline Kluver., MD (Rheumatology) Oneta Rack, MD (Dermatology)  Extended Emergency Contact Information Primary Emergency Contact: Geist,Guy Address: [address] [place], [place] [address] [place] Phone: [phone] Relation: Spouse  Code Status: full code Goals of Care: Advanced Directive information Advanced Directives 03/19/2020  Does Patient Have a Medical Advance Directive? Yes  Type of Advance Directive [place]  Does patient want to make changes to medical advance directive? No - Patient declined  Copy of [place] in Chart? -   Chief Complaint  Patient presents with  . Establish Care    New patient to establish care.... concerns are joint pain, history of  rheumatoid arthritis and she's not sure she is getting enough protein . Possible UTI    . Health Maintenance    hepatitis C screening, PNA  and HIV screening     HPI: Patient is a 64 y.o. female seen today to establish with [facility].  Records have been requested from Dr. Maryland Pink.    She is from [place], moved here 6 yrs ago.  They moved here to be with her husband's mother.  She's always been really fit.    Her daughter is a resident after DO training.  She herself is not a pill taker.    She went to rheumatology at [facility] 6 yrs ago.  She had hurt her thumb and the other joints had also gotten worse.  Within a year, the left started changing.  RF was just slightlly above normal.  Hydroxychloroquine gave her headaches.  He was not sure if had RA. She read an article about gout  from low estrogen.    She used to be a running which she gave up 25-30 yrs ago--does power walking up hills and hiking.  In last 3 months, great toes of both feet bothersome.  Maybe due to orthotics.  Went to [facility] and got specialty orthotics there.   Wants to stay active-they just bought an RV and want to travel.  She is having extreme urgency when she has to go to the bathroom.  Has 3 kids and has always had a little.  Worse in the last 1.5 wks.  It just came on.  She's going through 2-3 pads per day. Burning leading up to going.  No suprapubic tenderness or flank pain.  No change in urine character.  Does hydrate well.    Has bad Raynaud's--painful even reaching into the freezer.  In the 72s in [place] she'd get it.  Even in the 50s here, it's bad.    She's had a left ovarian cyst--fairly large--when she first saw gyn here, she was alarmed, but it has not grown much.  Dr Leafy Ro.  Does not have a gyn or NP at this time  07/31/16 was last [place].  It was $500 to get it done.  She has a high pain threshold and never really had much cramps or symptoms down there.  Bowels move great.  No bloating.  Wt 127 lbs, but usually has been about 120.  BP is normally in the 120s.    She has been having some pain in her left buttock if they travel for a while--burns and goes to knee.  Wonders about stretches or exercises to do to help with this.  Has very tight hamstrings.   Varicose veins--had sclerotherapy done.  Painful in the summer behind her left knee.  Feels bruised and black and blue.  Lives with those.   Started during a pregnancy.  Had biopsy on right shoulder.  Had too many keratoses burned off.  Sees derm:  Dr. Cy Blamer.    Unsure about tdap--one in 2009.    Her parents both had dementia--dad had "organic brain disease"--was electrical engineer--sounds like frontotemporal dementia.  Mom had dementia related to PD.  She's a retired Therapist, sports.  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  Did med surg, intensive care peds.    Her husband is an Art gallery manager.    Has tinnitus also.   Wakes up at times with a "fuzzy feeling" in both hands.  Not tingly.    Past Medical History:  Diagnosis Date  . Amenorrhea   . BCC (basal cell carcinoma), leg   . Cancer ([place])    skin  . Cataract   . Osteoarthritis of hands, bilateral   . Ovarian cyst   . Raynaud disease   . Tendonitis   . Varicose veins of left lower extremity    Past Surgical History:  Procedure Laterality Date  . BARTHOLIN CYST MARSUPIALIZATION    . BASAL CELL CARCINOMA EXCISION     Left Side of Face, Two on Back, Left just above upper Lip  . CATARACT EXTRACTION     Left Eye and Right Eye with implants  . EXCISION NEUROMA    . EYE SURGERY    . FINGER SURGERY     Surgical release of adhesion of right little finger  . hemithyroidectomy    . HERNIA REPAIR    . marsupization    . morton's neuroma/ neurectomy    . SCLEROTHERAPY    . THYROID LOBECTOMY    . VEIN SURGERY     Left and Right legs  . WISDOM TOOTH EXTRACTION      Social History   Socioeconomic History  . Marital status: Married    Spouse name: Not on file  . Number of children: Not on file  . Years of education: Not on file  . Highest education level: Not on file  Occupational History  . Not on file  Tobacco Use  . Smoking status: Never Smoker  . Smokeless tobacco: Never Used  Substance and Sexual Activity  . Alcohol use: Yes    Alcohol/week: 0.0 standard drinks    Comment: 5 Glasses of Wine  . Drug use: No  . Sexual activity: Not Currently  Other Topics Concern  . Not on file  Social History Narrative   Diet:  General/ Heart Healthy       Do you drink/ eat things with caffeine?  1-2 cups coffee in am (usually 1/2 caff strength)       Marital status:  Married                             What year were you married ? 1981      Do you live in a house, apartment,assistred living, condo,  trailer, etc.)? House  Is it one or more stories?  2 Story      How many persons live in your home ? 2      Do you have any pets in your home ?(please list) None      Highest Level of education completed: BSN / [employer]       Current or past profession: Retired Therapist, sports      Do you exercise?  Yes                            Type & how often  Daily 3 mile fast Pace walk / Every other day: Strength Training (not consistent, yet)       ADVANCED DIRECTIVES (Please bring copies)      Do you have a living will? Yes      Do you have a DNR form?  No                     If not, do you want to discuss one?       Do you have signed POA?HPOA forms?  Yes               If so, please bring to your appointment      FUNCTIONAL STATUS- To be completed by Spouse / child / Staff       Do you have difficulty bathing or dressing yourself ? No       Do you have difficulty preparing food or eating ? No      Do you have difficulty managing your mediation ? No      Do you have difficulty managing your finances ? No      Do you have difficulty affording your medication ? No      Social Determinants of Health   Financial Resource Strain:   . Difficulty of Paying Living Expenses: Not on file  Food Insecurity:   . Worried About Charity fundraiser in the Last Year: Not on file  . Ran Out of Food in the Last Year: Not on file  Transportation Needs:   . Lack of Transportation (Medical): Not on file  . Lack of Transportation (Non-Medical): Not on file  Physical Activity:   . Days of Exercise per Week: Not on file  . Minutes of Exercise per Session: Not on file  Stress:   . Feeling of Stress : Not on file  Social Connections:   . Frequency of Communication with Friends and Family: Not on file  . Frequency of Social Gatherings with Friends and Family: Not on file  . Attends Religious Services: Not on file  . Active Member of Clubs or Organizations: Not on file  . Attends Theatre manager Meetings: Not on file  . Marital Status: Not on file    reports that she has never smoked. She has never used smokeless tobacco. She reports current alcohol use. She reports that she does not use drugs.  Functional Status Survey:    Family History  Problem Relation Age of Onset  . Breast cancer Maternal Aunt        50's  . Dementia Mother 5       Related to Paekinson's  . Osteoporosis Mother   . Thyroid disease Mother   . Pneumonia Father   . Dementia Father 36  . Hypertension Father   . Arthritis Brother   . Depression Sister   .  Psoriasis Brother   . Asthma Son   . Allergies Son   . Stroke Maternal Grandmother   . Coronary artery disease Maternal Grandfather   . Coronary artery disease Paternal Grandfather     Health Maintenance  Topic Date Due  . Hepatitis C Screening  Never done  . HIV Screening  Never done  . PNA vac Low Risk Adult (1 of 2 - PCV13) 03/02/2020  . PAP SMEAR-Modifier  07/20/2020  . MAMMOGRAM  05/11/2021  . COLONOSCOPY  08/21/2026  . TETANUS/TDAP  05/16/2027  . INFLUENZA VACCINE  Completed  . DEXA SCAN  Completed  . COVID-19 Vaccine  Completed    Allergies  Allergen Reactions  . Penicillins Anaphylaxis  . Hydroxychloroquine Other (See Comments)    Outpatient Encounter Medications as of 03/19/2020  Medication Sig  . meloxicam (MOBIC) 7.5 MG tablet Take 7.5 mg by mouth daily. Only take PRN  . acetaminophen (TYLENOL) 650 MG CR tablet Take 650 mg by mouth 2 (two) times daily.  . Calcium Citrate-Vitamin D (CITRACAL + D PO) Take 1 tablet by mouth daily. Strength / 400 mg/12.5 mg  . cetirizine (ZYRTEC) 10 MG tablet Take 10 mg by mouth daily.   Marland Kitchen GRAPE SEED EXTRACT PO Take 400 mg by mouth daily.   . melatonin 1 MG TABS tablet Take 3 mg by mouth at bedtime as needed.  . Multiple Vitamins-Minerals (CENTRUM SILVER ULTRA WOMENS PO) Take 1 tablet by mouth daily.  . Turmeric (QC TUMERIC COMPLEX) 500 MG CAPS Take 500 mg by mouth daily.  .  valACYclovir (VALTREX) 1000 MG tablet Take 1,000 mg by mouth as needed. For Cold Sore Management  . Wheat Dextrin (BENEFIBER PO) Take 1 tablet by mouth daily.   No facility-administered encounter medications on file as of 03/19/2020.    Review of Systems  Constitutional: Negative for chills, fever and malaise/fatigue.  HENT: Negative for congestion, hearing loss and sore throat.   Eyes: Negative for blurred vision.       Glasses  Respiratory: Negative for cough and shortness of breath.   Cardiovascular: Negative for chest pain, palpitations and leg swelling.  Gastrointestinal: Negative for abdominal pain, blood in stool, constipation, diarrhea, heartburn, melena, nausea and vomiting.  Genitourinary: Positive for frequency and urgency. Negative for dysuria, flank pain and hematuria.  Musculoskeletal: Positive for back pain, joint pain and myalgias. Negative for falls and neck pain.  Skin: Negative for itching and rash.  Neurological: Negative for dizziness, loss of consciousness and weakness.  Endo/Heme/Allergies: Does not bruise/bleed easily.  Psychiatric/Behavioral: Negative for depression and memory loss. The patient is not nervous/anxious and does not have insomnia.     Vitals:   03/19/20 1331  BP: 136/70  Pulse: 65  Resp: 16  Temp: 97.7 F (36.5 C)  SpO2: 99%  Weight: 127 lb (57.6 kg)  Height: 5\' 7"  (1.702 m)   Body mass index is 19.89 kg/m. Physical Exam Vitals reviewed.  Constitutional:      General: She is not in acute distress.    Appearance: Normal appearance. She is normal weight. She is not ill-appearing, toxic-appearing or diaphoretic.  HENT:     Head: Normocephalic and atraumatic.     Right Ear: Tympanic membrane, ear canal and external ear normal.     Left Ear: Tympanic membrane, ear canal and external ear normal.     Nose: Nose normal.     Mouth/Throat:     Pharynx: Oropharynx is clear.  Eyes:     Extraocular  Movements: Extraocular movements intact.      Conjunctiva/sclera: Conjunctivae normal.     Pupils: Pupils are equal, round, and reactive to light.  Cardiovascular:     Rate and Rhythm: Normal rate and regular rhythm.     Heart sounds: No murmur heard.      Comments: Audible click  Pulmonary:     Effort: Pulmonary effort is normal.     Breath sounds: Normal breath sounds. No wheezing, rhonchi or rales.  Abdominal:     General: Bowel sounds are normal. There is no distension.     Palpations: Abdomen is soft.     Tenderness: There is no abdominal tenderness. There is no guarding or rebound.  Musculoskeletal:        General: Normal range of motion.     Cervical back: Neck supple.     Right lower leg: No edema.     Left lower leg: No edema.  Lymphadenopathy:     Cervical: No cervical adenopathy.  Skin:    General: Skin is warm and dry.     Capillary Refill: Capillary refill takes less than 2 seconds.  Neurological:     General: No focal deficit present.     Mental Status: She is alert and oriented to person, place, and time.     Cranial Nerves: No cranial nerve deficit.     Sensory: No sensory deficit.     Motor: No weakness.     Coordination: Coordination normal.     Gait: Gait normal.     Deep Tendon Reflexes: Reflexes normal.     Comments: Mild tenderness of right paravertebral muscles in lower thoracic and upper lumbar region; tenderness of PIP joints of hands especially left hand; also CMC joints bilaterally  Psychiatric:        Mood and Affect: Mood normal.        Behavior: Behavior normal.        Thought Content: Thought content normal.        Judgment: Judgment normal.     Labs reviewed: Basic Metabolic Panel: No results for input(s): NA, K, CL, CO2, GLUCOSE, BUN, CREATININE, CALCIUM, MG, PHOS in the last 8760 hours. Liver Function Tests: No results for input(s): AST, ALT, ALKPHOS, BILITOT, PROT, ALBUMIN in the last 8760 hours. No results for input(s): LIPASE, AMYLASE in the last 8760 hours. No results for  input(s): AMMONIA in the last 8760 hours. CBC: No results for input(s): WBC, NEUTROABS, HGB, HCT, MCV, PLT in the last 8760 hours. Cardiac Enzymes: No results for input(s): CKTOTAL, CKMB, CKMBINDEX, TROPONINI in the last 8760 hours. BNP: Invalid input(s): POCBNP No results found for: HGBA1C No results found for: TSH No results found for: VITAMINB12 No results found for: FOLATE No results found for: IRON, TIBC, FERRITIN  Imaging and Procedures noted on new patient packet:   Mammogram 04/2019 Ovarian cyst [place] 07/31/16 3.64x4.87x3.35cm, 08/12/17 3.44x4.72x2.53x 3.07cm  Pap smear 07/20/17 08/20/16 colonoscopy with polypectomy from rectum--adenoma--due next in 08/2021 Bone density 03/06/11 was normal  Assessment/Plan 1. Inflammatory arthritis - sounds like she had mildly positive rheumatoid factor and has had considerable progression of arthritis but is just 64 now - will eval again for RA vs gouty arthritis (doubt but she was reading about postmenopausal women being more prone to gout due to low estrogen) - also has Raynaud's - CBC with Differential/Platelet - Basic metabolic panel - Hepatic function panel - C-reactive protein - Sedimentation rate - Uric Acid  2. Cyst of ovary, left -I'd like  for her to have a f/u US of this done to be sure it's not growing.  Last was 08/12/17 - CBC with Differential/Platelet - Basic metabolic panel - Hepatic function panel  3. Varicose veins of leg with pain, left -with some chronic pain and swelling at back of knee -monitor, discussed compression hose but she is not a fan in summer of this intervention - CBC with Differential/Platelet - Basic metabolic panel - Hepatic function panel  4. Encounter for hepatitis C screening test for low risk patient - check hep c screen  5. H/O partial thyroidectomy - TSH  6. Raynaud's disease without gangrene - notable, monitor - TSH - C-reactive protein - Sedimentation rate  7. Urinary urgency - seems  she has new symptoms in the pasts 2 wks and worsening of baseline urgency and leakage so r/o UTI - Urine Culture - Urinalysis, Routine w reflex microscopic  8. Adenomatous rectal polyp -removed on cscope 08/20/16 and due next 08/2021   Labs/tests ordered:   Lab Orders     Urine Culture     CBC with Differential/Platelet     Basic metabolic panel     Hepatic function panel     TSH     C-reactive protein     Hepatitis C antibody     Sedimentation rate     Urinalysis, Routine w reflex microscopic     Uric Acid     CBC and differential     CBC     Basic metabolic panel     Comprehensive metabolic panel     Hepatic function panel Oddly labs are showing up ordered more than once?    F/u in 1 wk to review labs, next steps  Laketia Vicknair L. Glema Takaki, D.O. [facility] [address], [place] [address] Cell Phone (Mon-Fri 8am-5pm):  [phone] On Call:  [phone] & follow prompts after 5pm & weekends Office Phone:  [phone] Office Fax:  [phone]

## 2020-03-21 LAB — URINALYSIS, ROUTINE W REFLEX MICROSCOPIC
Bacteria, UA: NONE SEEN /HPF
Bilirubin Urine: NEGATIVE
Glucose, UA: NEGATIVE
Hgb urine dipstick: NEGATIVE
Hyaline Cast: NONE SEEN /LPF
Ketones, ur: NEGATIVE
Nitrite: NEGATIVE
Protein, ur: NEGATIVE
RBC / HPF: NONE SEEN /HPF (ref 0–2)
Specific Gravity, Urine: 1.005 (ref 1.001–1.03)
WBC, UA: NONE SEEN /HPF (ref 0–5)
pH: 7.5 (ref 5.0–8.0)

## 2020-03-21 LAB — CBC WITH DIFFERENTIAL/PLATELET
Absolute Monocytes: 384 cells/uL (ref 200–950)
Basophils Absolute: 42 cells/uL (ref 0–200)
Basophils Relative: 1.3 %
Eosinophils Absolute: 173 cells/uL (ref 15–500)
Eosinophils Relative: 5.4 %
HCT: 39.1 % (ref 35.0–45.0)
Hemoglobin: 13.3 g/dL (ref 11.7–15.5)
Lymphs Abs: 1024 cells/uL (ref 850–3900)
MCH: 33 pg (ref 27.0–33.0)
MCHC: 34 g/dL (ref 32.0–36.0)
MCV: 97 fL (ref 80.0–100.0)
MPV: 10.9 fL (ref 7.5–12.5)
Monocytes Relative: 12 %
Neutro Abs: 1578 cells/uL (ref 1500–7800)
Neutrophils Relative %: 49.3 %
Platelets: 273 10*3/uL (ref 140–400)
RBC: 4.03 10*6/uL (ref 3.80–5.10)
RDW: 11.4 % (ref 11.0–15.0)
Total Lymphocyte: 32 %
WBC: 3.2 10*3/uL — ABNORMAL LOW (ref 3.8–10.8)

## 2020-03-21 LAB — BASIC METABOLIC PANEL
BUN: 9 mg/dL (ref 7–25)
CO2: 29 mmol/L (ref 20–32)
Calcium: 9.9 mg/dL (ref 8.6–10.4)
Chloride: 101 mmol/L (ref 98–110)
Creat: 0.64 mg/dL (ref 0.50–0.99)
Glucose, Bld: 96 mg/dL (ref 65–139)
Potassium: 4.2 mmol/L (ref 3.5–5.3)
Sodium: 138 mmol/L (ref 135–146)

## 2020-03-21 LAB — HEPATITIS C ANTIBODY
Hepatitis C Ab: NONREACTIVE
SIGNAL TO CUT-OFF: 0.01 (ref <1.00)

## 2020-03-21 LAB — HEPATIC FUNCTION PANEL
AG Ratio: 2 (calc) (ref 1.0–2.5)
ALT: 17 U/L (ref 6–29)
AST: 22 U/L (ref 10–35)
Albumin: 5 g/dL (ref 3.6–5.1)
Alkaline phosphatase (APISO): 70 U/L (ref 37–153)
Bilirubin, Direct: 0.2 mg/dL (ref 0.0–0.2)
Globulin: 2.5 g/dL (calc) (ref 1.9–3.7)
Indirect Bilirubin: 0.9 mg/dL (calc) (ref 0.2–1.2)
Total Bilirubin: 1.1 mg/dL (ref 0.2–1.2)
Total Protein: 7.5 g/dL (ref 6.1–8.1)

## 2020-03-21 LAB — URINE CULTURE
MICRO NUMBER:: 11177321
SPECIMEN QUALITY:: ADEQUATE

## 2020-03-21 LAB — SEDIMENTATION RATE: Sed Rate: 2 mm/h (ref 0–30)

## 2020-03-21 LAB — C-REACTIVE PROTEIN: CRP: 0.6 mg/L (ref <8.0)

## 2020-03-21 LAB — URIC ACID: Uric Acid, Serum: 2.5 mg/dL (ref 2.5–7.0)

## 2020-03-21 LAB — TSH: TSH: 2.93 mIU/L (ref 0.40–4.50)

## 2020-03-21 NOTE — Progress Notes (Signed)
Labs were completely normal except white blood cells running low at 3.2 (but they were 3.1 in sept and there is no specific line that is low so monitor) Inflammatory markers and gout test were negative at this time. Urinalysis showed only a few white cells and inadequate growth for infection.  If symptoms persist, we may need to repeat the sample or consider urology evaluation.

## 2020-03-29 ENCOUNTER — Encounter: Payer: Self-pay | Admitting: Internal Medicine

## 2020-03-29 ENCOUNTER — Other Ambulatory Visit: Payer: Self-pay

## 2020-03-29 ENCOUNTER — Ambulatory Visit (INDEPENDENT_AMBULATORY_CARE_PROVIDER_SITE_OTHER): Payer: Medicare Other | Admitting: Internal Medicine

## 2020-03-29 VITALS — BP 126/78 | HR 75 | Temp 97.3°F | Resp 18 | Ht 67.0 in | Wt 126.6 lb

## 2020-03-29 DIAGNOSIS — E890 Postprocedural hypothyroidism: Secondary | ICD-10-CM | POA: Diagnosis not present

## 2020-03-29 DIAGNOSIS — I83812 Varicose veins of left lower extremities with pain: Secondary | ICD-10-CM | POA: Diagnosis not present

## 2020-03-29 DIAGNOSIS — R3915 Urgency of urination: Secondary | ICD-10-CM | POA: Diagnosis not present

## 2020-03-29 DIAGNOSIS — M8949 Other hypertrophic osteoarthropathy, multiple sites: Secondary | ICD-10-CM | POA: Diagnosis not present

## 2020-03-29 DIAGNOSIS — M159 Polyosteoarthritis, unspecified: Secondary | ICD-10-CM

## 2020-03-29 DIAGNOSIS — Z114 Encounter for screening for human immunodeficiency virus [HIV]: Secondary | ICD-10-CM

## 2020-03-29 NOTE — Patient Instructions (Signed)
20-66mmHg compression socks, but first try OTC sports compression socks first

## 2020-03-29 NOTE — Progress Notes (Signed)
Location:  [facility] clinic Provider:  Tayvion Lauder L. Mariea Cruz, D.O., C.M.D.  Goals of Care:  Advanced Directives 03/29/2020  Does Patient Have a Medical Advance Directive? Yes  Type of Advance Directive [place]  Does patient want to make changes to medical advance directive? No - Patient declined  Copy of [place] in Chart? -   Chief Complaint  Patient presents with  . Follow-up    1 Week Follow Up on lab results    HPI: Patient is a 64 y.o. female seen today for f/u on labs.    Urinary symptoms have improved since.  For the past week and a half.  She had two annoying accidents, but nothing like she'd had.  She'd had caffeine both of those days.  She's going to try to back off caffeine.    Notes her wbc has always run around 3.    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   Reviewed labs which were all in normal range.  Past Medical History:  Diagnosis Date  . Amenorrhea   . BCC (basal cell carcinoma), leg   . Cancer ([place])    skin  . Cataract   . Osteoarthritis of hands, bilateral   . Ovarian cyst   . Raynaud disease   . Tendonitis   . Varicose veins of left lower extremity     Past Surgical History:  Procedure Laterality Date  . BARTHOLIN CYST MARSUPIALIZATION  12/27/2014  . BASAL CELL CARCINOMA EXCISION  01/30/14, 09/28/14, 04/01/18, 01/01/17   Left Side of Face, Two on Back, Left just above upper Lip  . CATARACT EXTRACTION  12/12/13, 01/30/14   Left Eye and Right Eye with implants  . EXCISION NEUROMA    . EYE SURGERY    . FINGER SURGERY     Surgical release of adhesion of right little finger  . hemithyroidectomy    . HERNIA REPAIR    . morton's neuroma/ neurectomy Left 02/06/2009  . SCLEROTHERAPY     varicose veins left and right  . THYROID LOBECTOMY    . VEIN SURGERY     Left and Right legs  . WISDOM TOOTH EXTRACTION      Allergies  Allergen Reactions  .  Penicillins Anaphylaxis  . Hydroxychloroquine Other (See Comments)    Outpatient Encounter Medications as of 03/29/2020  Medication Sig  . acetaminophen (TYLENOL) 650 MG CR tablet Take 650 mg by mouth 2 (two) times daily.  . Calcium Citrate-Vitamin D (CITRACAL + D PO) Take 1 tablet by mouth daily. Strength / 400 mg/12.5 mg  . cetirizine (ZYRTEC) 10 MG tablet Take 10 mg by mouth daily.   Marland Kitchen GRAPE SEED EXTRACT PO Take 400 mg by mouth daily.   . melatonin 1 MG TABS tablet Take 3 mg by mouth at bedtime as needed.  . meloxicam (MOBIC) 7.5 MG tablet Take 7.5 mg by mouth daily as needed.   . Multiple Vitamins-Minerals (CENTRUM SILVER ULTRA WOMENS PO) Take 1 tablet by mouth daily.  . Turmeric (QC TUMERIC COMPLEX) 500 MG CAPS Take 500 mg by mouth daily.  . valACYclovir (VALTREX) 1000 MG tablet Take 1,000 mg by mouth as needed. For Cold Sore Management  . Wheat Dextrin (BENEFIBER PO) Take 1 tablet by mouth daily.   No facility-administered encounter medications on file as of 03/29/2020.    Review of Systems:  Review of Systems  Constitutional: Negative for chills,  fever and malaise/fatigue.  HENT: Negative for congestion and sore throat.   Eyes: Negative for blurred vision.  Respiratory: Negative for cough and shortness of breath.   Cardiovascular: Negative for chest pain, palpitations and leg swelling.  Gastrointestinal: Negative for abdominal pain, constipation, diarrhea and heartburn.  Genitourinary: Positive for urgency. Negative for dysuria and frequency.       Some incontinence  Musculoskeletal: Positive for joint pain. Negative for falls.  Skin: Negative for itching and rash.  Neurological: Negative for dizziness and loss of consciousness.  Endo/Heme/Allergies: Does not bruise/bleed easily.  Psychiatric/Behavioral: Negative for depression and memory loss. The patient is not nervous/anxious and does not have insomnia.     Health Maintenance  Topic Date Due  . HIV Screening  Never  done  . PNA vac Low Risk Adult (1 of 2 - PCV13) 03/02/2020  . PAP SMEAR-Modifier  07/20/2020  . MAMMOGRAM  05/11/2021  . COLONOSCOPY  08/21/2026  . TETANUS/TDAP  05/16/2027  . INFLUENZA VACCINE  Completed  . DEXA SCAN  Completed  . COVID-19 Vaccine  Completed  . Hepatitis C Screening  Completed    Physical Exam: Vitals:   03/29/20 0733  Pulse: 75  Resp: 18  Temp: (!) 97.3 F (36.3 C)  SpO2: 100%  Weight: 126 lb 9.6 oz (57.4 kg)  Height: 5' 7"  (1.702 m)   Body mass index is 19.83 kg/m. Physical Exam Vitals reviewed.  Constitutional:      Appearance: Normal appearance.  HENT:     Head: Normocephalic and atraumatic.  Cardiovascular:     Rate and Rhythm: Normal rate and regular rhythm.     Pulses: Normal pulses.     Heart sounds: Normal heart sounds.  Pulmonary:     Effort: Pulmonary effort is normal.     Breath sounds: Normal breath sounds. No wheezing, rhonchi or rales.  Abdominal:     General: Bowel sounds are normal.  Musculoskeletal:        General: Normal range of motion.     Right lower leg: No edema.     Left lower leg: No edema.     Comments: Heberden's and bouchard's nodes of fingers  Skin:    General: Skin is warm and dry.  Neurological:     General: No focal deficit present.     Mental Status: She is alert and oriented to person, place, and time.     Motor: No weakness.     Gait: Gait normal.  Psychiatric:        Mood and Affect: Mood normal.     Labs reviewed: Basic Metabolic Panel: Recent Labs    01/26/20 0000 03/19/20 1508  NA 137 138  K 4.3 4.2  CL 103 101  CO2 31* 29  GLUCOSE  --  96  BUN 11 9  CREATININE 0.7 0.64  CALCIUM 9.1 9.9  TSH  --  2.93   Liver Function Tests: Recent Labs    01/26/20 0000 03/19/20 1508  AST 26 22  ALT 23 17  ALKPHOS 66  --   BILITOT  --  1.1  PROT  --  7.5   No results for input(s): LIPASE, AMYLASE in the last 8760 hours. No results for input(s): AMMONIA in the last 8760 hours. CBC: Recent  Labs    01/26/20 0000 03/19/20 1508  WBC 3.1 3.2*  NEUTROABS  --  1,578  HGB 12.7 13.3  HCT 39 39.1  MCV  --  97.0  PLT 253 273  Lipid Panel: No results for input(s): CHOL, HDL, LDLCALC, TRIG, CHOLHDL, LDLDIRECT in the last 8760 hours. No results found for: HGBA1C  Procedures since last visit: No results found.  Assessment/Plan 1. Primary osteoarthritis involving multiple joints -on signs of inflammation via ESR, CRP or uric acid -discussed staying active and use of the affected joints to help prevent progression  2. Urinary urgency -remains, but a bit improved from last week -discussed regular trips to bathroom, cranberry capsules, regular changes of pads and pantiliners, hydration with water, wiping front to back to prevent infection  3. Varicose veins of leg with pain, left -begin sports compression socks; if ineffective to help with pain (especially in summer when bothers her more), may need formal 20-30 compression socks  4.  H/o partial thyroidectomy -tsh was wnl and f/u in 1 yr.  Labs/tests ordered:   Lab Orders     CBC with Differential/Platelet     COMPLETE METABOLIC PANEL WITH GFR     TSH HIV screen  Can't check lipids b/c no history of elevation and medicare doesn't cover screening lipids  Next appt:  1 year for annual visit with fasting labs before  With new pneumonia vaccines coming out, we opted to wait on giving an additional pneumonia vaccine (had pneumovax already)  Mia Cruz L. Jamin Panther, D.O. [facility] [address], [place] [address] Cell Phone (Mon-Fri 8am-5pm):  [phone] On Call:  [phone] & follow prompts after 5pm & weekends Office Phone:  [phone] Office Fax:  [phone]

## 2020-05-30 ENCOUNTER — Encounter: Payer: Self-pay | Admitting: Internal Medicine

## 2020-06-06 ENCOUNTER — Other Ambulatory Visit: Payer: Self-pay | Admitting: Internal Medicine

## 2020-06-06 DIAGNOSIS — Z1231 Encounter for screening mammogram for malignant neoplasm of breast: Secondary | ICD-10-CM

## 2020-06-25 ENCOUNTER — Other Ambulatory Visit: Payer: Self-pay

## 2020-06-25 ENCOUNTER — Ambulatory Visit
Admission: RE | Admit: 2020-06-25 | Discharge: 2020-06-25 | Disposition: A | Discharge status: Home / Self Care | Payer: Medicare Other | Source: Ambulatory Visit | Attending: Internal Medicine | Admitting: Internal Medicine

## 2020-06-25 DIAGNOSIS — Z1231 Encounter for screening mammogram for malignant neoplasm of breast: Secondary | ICD-10-CM | POA: Diagnosis present

## 2020-06-28 NOTE — Progress Notes (Signed)
Normal mammogram

## 2020-07-02 ENCOUNTER — Encounter: Payer: Self-pay | Admitting: Internal Medicine

## 2020-08-09 ENCOUNTER — Other Ambulatory Visit: Payer: Self-pay | Admitting: Nurse Practitioner

## 2020-08-09 DIAGNOSIS — M159 Polyosteoarthritis, unspecified: Secondary | ICD-10-CM

## 2020-08-09 DIAGNOSIS — E890 Postprocedural hypothyroidism: Secondary | ICD-10-CM

## 2020-08-09 DIAGNOSIS — Z114 Encounter for screening for human immunodeficiency virus [HIV]: Secondary | ICD-10-CM

## 2020-08-09 DIAGNOSIS — M8949 Other hypertrophic osteoarthropathy, multiple sites: Secondary | ICD-10-CM

## 2020-08-20 ENCOUNTER — Encounter: Payer: Self-pay | Admitting: Nurse Practitioner

## 2021-03-20 ENCOUNTER — Other Ambulatory Visit: Payer: Self-pay

## 2021-03-20 ENCOUNTER — Other Ambulatory Visit: Payer: Medicare Other

## 2021-03-20 DIAGNOSIS — Z114 Encounter for screening for human immunodeficiency virus [HIV]: Secondary | ICD-10-CM

## 2021-03-20 DIAGNOSIS — M159 Polyosteoarthritis, unspecified: Secondary | ICD-10-CM

## 2021-03-20 DIAGNOSIS — E890 Postprocedural hypothyroidism: Secondary | ICD-10-CM

## 2021-03-21 LAB — COMPLETE METABOLIC PANEL WITH GFR
AG Ratio: 2.1 (calc) (ref 1.0–2.5)
ALT: 15 U/L (ref 6–29)
AST: 21 U/L (ref 10–35)
Albumin: 4.4 g/dL (ref 3.6–5.1)
Alkaline phosphatase (APISO): 62 U/L (ref 37–153)
BUN: 14 mg/dL (ref 7–25)
CO2: 24 mmol/L (ref 20–32)
Calcium: 9.2 mg/dL (ref 8.6–10.4)
Chloride: 102 mmol/L (ref 98–110)
Creat: 0.71 mg/dL (ref 0.50–1.05)
Globulin: 2.1 g/dL (calc) (ref 1.9–3.7)
Glucose, Bld: 84 mg/dL (ref 65–99)
Potassium: 4.1 mmol/L (ref 3.5–5.3)
Sodium: 138 mmol/L (ref 135–146)
Total Bilirubin: 0.9 mg/dL (ref 0.2–1.2)
Total Protein: 6.5 g/dL (ref 6.1–8.1)
eGFR: 94 mL/min/{1.73_m2} (ref >=60)

## 2021-03-21 LAB — CBC WITH DIFFERENTIAL/PLATELET
Absolute Monocytes: 288 cells/uL (ref 200–950)
Basophils Absolute: 39 cells/uL (ref 0–200)
Basophils Relative: 1.4 %
Eosinophils Absolute: 328 cells/uL (ref 15–500)
Eosinophils Relative: 11.7 %
HCT: 36.7 % (ref 35.0–45.0)
Hemoglobin: 12.4 g/dL (ref 11.7–15.5)
Lymphs Abs: 916 cells/uL (ref 850–3900)
MCH: 32.6 pg (ref 27.0–33.0)
MCHC: 33.8 g/dL (ref 32.0–36.0)
MCV: 96.6 fL (ref 80.0–100.0)
MPV: 11.1 fL (ref 7.5–12.5)
Monocytes Relative: 10.3 %
Neutro Abs: 1229 cells/uL — ABNORMAL LOW (ref 1500–7800)
Neutrophils Relative %: 43.9 %
Platelets: 262 10*3/uL (ref 140–400)
RBC: 3.8 10*6/uL (ref 3.80–5.10)
RDW: 11.8 % (ref 11.0–15.0)
Total Lymphocyte: 32.7 %
WBC: 2.8 10*3/uL — ABNORMAL LOW (ref 3.8–10.8)

## 2021-03-21 LAB — TSH: TSH: 2.9 mIU/L (ref 0.40–4.50)

## 2021-03-25 ENCOUNTER — Ambulatory Visit: Payer: Medicare Other | Admitting: Internal Medicine

## 2021-03-25 ENCOUNTER — Ambulatory Visit (INDEPENDENT_AMBULATORY_CARE_PROVIDER_SITE_OTHER): Payer: Medicare Other | Admitting: Nurse Practitioner

## 2021-03-25 ENCOUNTER — Encounter: Payer: Self-pay | Admitting: Nurse Practitioner

## 2021-03-25 ENCOUNTER — Other Ambulatory Visit: Payer: Self-pay

## 2021-03-25 VITALS — BP 122/78 | HR 73 | Temp 97.3°F | Ht 67.0 in | Wt 130.0 lb

## 2021-03-25 DIAGNOSIS — E2839 Other primary ovarian failure: Secondary | ICD-10-CM

## 2021-03-25 DIAGNOSIS — E890 Postprocedural hypothyroidism: Secondary | ICD-10-CM | POA: Diagnosis not present

## 2021-03-25 DIAGNOSIS — I7300 Raynaud's syndrome without gangrene: Secondary | ICD-10-CM

## 2021-03-25 DIAGNOSIS — I83812 Varicose veins of left lower extremities with pain: Secondary | ICD-10-CM

## 2021-03-25 DIAGNOSIS — M19041 Primary osteoarthritis, right hand: Secondary | ICD-10-CM | POA: Diagnosis not present

## 2021-03-25 DIAGNOSIS — M19042 Primary osteoarthritis, left hand: Secondary | ICD-10-CM

## 2021-03-25 MED ORDER — VALACYCLOVIR HCL 1 G PO TABS: 1000.0000 mg | ORAL_TABLET | ORAL | 1 refills | Status: DC | PRN

## 2021-03-25 MED ORDER — MELOXICAM 7.5 MG PO TABS: 7.5000 mg | ORAL_TABLET | Freq: Every day | ORAL | 11 refills | Status: DC | PRN

## 2021-03-25 NOTE — Patient Instructions (Addendum)
Sign record release for [facility] vaccines.   Please schedule in office AWV -first one :)    Dr Leontine Locket Marion Miller Cook  [phone]

## 2021-03-25 NOTE — Progress Notes (Signed)
Careteam: Patient Care Team: Lauree Chandler, NP as PCP - General (Geriatric Medicine) Edrick Kins, DPM as Consulting Physician (Podiatry) Benjaman Kindler, MD as Consulting Physician (Obstetrics and Gynecology) Emmaline Kluver., MD (Rheumatology) Oneta Rack, MD (Dermatology)  PLACE OF SERVICE:  [facility] Directive information Does Patient Have a Medical Advance Directive?: Yes, Type of Advance Directive: [place];Living will, Does patient want to make changes to medical advance directive?: No - Patient declined  Allergies  Allergen Reactions   Penicillins Anaphylaxis   Hydroxychloroquine Other (See Comments)    Chief Complaint  Patient presents with   Medical Management of Chronic Issues    1 year follow-up and discuss labs(copy printed). Discuss need for pneumonia vaccine and covid #4. Flu vaccine today      HPI: Patient is a 65 y.o. female for yearly follow up.    Travels often. Has travaled to several states in the last months.   No longer being monitored by endocrinologist- tsh has been stable.   Last bone density was several years ago. Taking cal and vit d. Walks 2.5 miles daily. Also doing upper body strength training.   OA- bad in hands, takes meloxicam PRN and tylenol. Uses 5 days a month.   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 Pt relations  From [place]  Was in quality when she moved.   [facility] due to pain and swelling in lower leg. Recommended doing ablation due to pain. She plans to get that done soon if she can get in. Plans to do bilaterally Doing her compression hose and keeping legs elevated.   Taking b12 1000 mcg daily due to tingling in hands.   Review of Systems:  Review of Systems  Constitutional:  Negative for chills, fever and weight loss.  HENT:  Negative for tinnitus.   Respiratory:  Negative for cough, sputum  production and shortness of breath.   Cardiovascular:  Negative for chest pain, palpitations and leg swelling.  Gastrointestinal:  Negative for abdominal pain, constipation, diarrhea and heartburn.  Genitourinary:  Negative for dysuria, frequency and urgency.  Musculoskeletal:  Negative for back pain, falls, joint pain and myalgias.  Skin: Negative.   Neurological:  Negative for dizziness and headaches.  Psychiatric/Behavioral:  Negative for depression and memory loss. The patient does not have insomnia.    Past Medical History:  Diagnosis Date   Amenorrhea    BCC (basal cell carcinoma), leg    Cancer ([place])    skin   Cataract    Osteoarthritis of hands, bilateral    Ovarian cyst    Raynaud disease    Tendonitis    Varicose veins of left lower extremity    Past Surgical History:  Procedure Laterality Date   BARTHOLIN CYST MARSUPIALIZATION  12/27/2014   BASAL CELL CARCINOMA EXCISION  01/30/14, 09/28/14, 04/01/18, 01/01/17   Left Side of Face, Two on Back, Left just above upper Lip   CATARACT EXTRACTION  12/12/13, 01/30/14   Left Eye and Right Eye with implants   EXCISION NEUROMA     EYE SURGERY     FINGER SURGERY     Surgical release of adhesion of right little finger   hemithyroidectomy     HERNIA REPAIR     morton's neuroma/ neurectomy Left 02/06/2009   SCLEROTHERAPY     varicose veins left and right   THYROID  LOBECTOMY     VEIN SURGERY     Left and Right legs   WISDOM TOOTH EXTRACTION     Social History:   reports that she has never smoked. She has never used smokeless tobacco. She reports current alcohol use. She reports that she does not use drugs.  Family History  Problem Relation Age of Onset   Breast cancer Maternal Aunt        33's   Dementia Mother 21       Related to Paekinson's   Osteoporosis Mother    Thyroid disease Mother    Pneumonia Father    Dementia Father 5   Hypertension Father    Arthritis Brother    Depression Sister    Psoriasis Brother     Asthma Son    Allergies Son    Stroke Maternal Grandmother    Coronary artery disease Maternal Grandfather    Coronary artery disease Paternal Grandfather     Medications: Patient's Medications  New Prescriptions   No medications on file  Previous Medications   ACETAMINOPHEN (TYLENOL) 650 MG CR TABLET    Take 650 mg by mouth 2 (two) times daily.   CALCIUM CITRATE-VITAMIN D (CITRACAL + D PO)    Take 1 tablet by mouth daily. Strength / 400 mg/12.5 mg   CETIRIZINE (ZYRTEC) 10 MG TABLET    Take 10 mg by mouth daily.    CYANOCOBALAMIN 1000 MCG TABLET    Take 1,000 mcg by mouth daily.   GRAPE SEED EXTRACT PO    Take 400 mg by mouth daily.    MELATONIN 10 MG TABS    Take 1 tablet by mouth at bedtime as needed.   MELOXICAM (MOBIC) 7.5 MG TABLET    Take 7.5 mg by mouth daily as needed.    MULTIPLE VITAMINS-MINERALS (CENTRUM SILVER ULTRA WOMENS PO)    Take 1 tablet by mouth daily.   TURMERIC 500 MG CAPS    Take 500 mg by mouth daily.   VALACYCLOVIR (VALTREX) 1000 MG TABLET    Take 1,000 mg by mouth as needed. For Cold Sore Management   WHEAT DEXTRIN (BENEFIBER PO)    Take 1 tablet by mouth daily.  Modified Medications   No medications on file  Discontinued Medications   MELATONIN 1 MG TABS TABLET    Take 3 mg by mouth at bedtime as needed.    Physical Exam:  Vitals:   03/25/21 0831  BP: 122/78  Pulse: 73  Temp: (!) 97.3 F (36.3 C)  TempSrc: Temporal  SpO2: 98%  Weight: 130 lb (59 kg)  Height: 5\' 7"  (1.702 m)   Body mass index is 20.36 kg/m. Wt Readings from Last 3 Encounters:  03/25/21 130 lb (59 kg)  03/29/20 126 lb 9.6 oz (57.4 kg)  03/19/20 127 lb (57.6 kg)    Physical Exam Constitutional:      General: She is not in acute distress.    Appearance: She is well-developed. She is not diaphoretic.  HENT:     Head: Normocephalic and atraumatic.     Mouth/Throat:     Pharynx: No oropharyngeal exudate.  Eyes:     Conjunctiva/sclera: Conjunctivae normal.     Pupils:  Pupils are equal, round, and reactive to light.  Cardiovascular:     Rate and Rhythm: Normal rate and regular rhythm.     Heart sounds: Normal heart sounds.  Pulmonary:     Effort: Pulmonary effort is normal.     Breath  sounds: Normal breath sounds.  Abdominal:     General: Bowel sounds are normal.     Palpations: Abdomen is soft.  Musculoskeletal:     Cervical back: Normal range of motion and neck supple.     Right lower leg: No edema.     Left lower leg: No edema.  Skin:    General: Skin is warm and dry.  Neurological:     Mental Status: She is alert.  Psychiatric:        Mood and Affect: Mood normal.    Labs reviewed: Basic Metabolic Panel: Recent Labs    03/20/21 0806  NA 138  K 4.1  CL 102  CO2 24  GLUCOSE 84  BUN 14  CREATININE 0.71  CALCIUM 9.2  TSH 2.90   Liver Function Tests: Recent Labs    03/20/21 0806  AST 21  ALT 15  BILITOT 0.9  PROT 6.5   No results for input(s): LIPASE, AMYLASE in the last 8760 hours. No results for input(s): AMMONIA in the last 8760 hours. CBC: Recent Labs    03/20/21 0806  WBC 2.8*  NEUTROABS 1,229*  HGB 12.4  HCT 36.7  MCV 96.6  PLT 262   Lipid Panel: No results for input(s): CHOL, HDL, LDLCALC, TRIG, CHOLHDL, LDLDIRECT in the last 8760 hours. TSH: Recent Labs    03/20/21 0806  TSH 2.90   A1C: No results found for: HGBA1C   Assessment/Plan 1. Primary osteoarthritis of both hands -stable, uses mobic PRN. Tylenol  - meloxicam (MOBIC) 7.5 MG tablet; Take 1 tablet (7.5 mg total) by mouth daily as needed.  Dispense: 30 tablet; Refill: 11  2. Estrogen deficiency - DG Bone Density; Future  3. H/O partial thyroidectomy TSH has been stable.   4. Varicose veins of leg with pain, left Following with vein and vascular, planning on getting ablation.   5. Raynaud's disease without gangrene -ongoing, stable.    Next appt: yearly, labs prior  Mia Cruz K. [place], [facility] [phone]

## 2021-04-08 ENCOUNTER — Other Ambulatory Visit: Payer: Self-pay | Admitting: Nurse Practitioner

## 2021-04-08 DIAGNOSIS — Z1231 Encounter for screening mammogram for malignant neoplasm of breast: Secondary | ICD-10-CM

## 2021-04-18 ENCOUNTER — Other Ambulatory Visit: Payer: Self-pay | Admitting: Nurse Practitioner

## 2021-06-26 ENCOUNTER — Ambulatory Visit (INDEPENDENT_AMBULATORY_CARE_PROVIDER_SITE_OTHER): Payer: Medicare Other | Admitting: Adult Health

## 2021-06-26 ENCOUNTER — Encounter: Payer: Self-pay | Admitting: Adult Health

## 2021-06-26 ENCOUNTER — Other Ambulatory Visit: Payer: Self-pay

## 2021-06-26 VITALS — BP 148/60 | HR 84 | Temp 96.9°F | Resp 16 | Ht 67.0 in | Wt 126.4 lb

## 2021-06-26 DIAGNOSIS — R002 Palpitations: Secondary | ICD-10-CM | POA: Diagnosis not present

## 2021-06-26 DIAGNOSIS — E538 Deficiency of other specified B group vitamins: Secondary | ICD-10-CM

## 2021-06-26 DIAGNOSIS — I498 Other specified cardiac arrhythmias: Secondary | ICD-10-CM | POA: Diagnosis not present

## 2021-06-26 MED ORDER — METOPROLOL SUCCINATE ER 25 MG PO TB24: 12.5000 mg | ORAL_TABLET | Freq: Every day | ORAL | 0 refills | Status: DC

## 2021-06-26 NOTE — Patient Instructions (Signed)
To begin toprol xl 12.5 mg daily  Will follow up with cardiology Dr. Donnelly Angelica at [facility].

## 2021-06-26 NOTE — Progress Notes (Signed)
Location:  [facility]   Place of Service:   clinic     Allergies  Allergen Reactions   Penicillins Anaphylaxis   Hydroxychloroquine Other (See Comments)    Chief Complaint  Patient presents with   Acute Visit    Patient complains of palpitations.     HPI:  Since this weekend she has been having palpitations. Nothing seems to make them worse. Her heart rate is irregular. She feels pvc's present. The rhythm comes in bursts of 2-3 at a time. She denies any chest pain or shortness of breath. She has started B12 this past January. This was not prescribed.   Past Medical History:  Diagnosis Date   Amenorrhea    BCC (basal cell carcinoma), leg    Cancer ([place])    skin   Cataract    Osteoarthritis of hands, bilateral    Ovarian cyst    Raynaud disease    Tendonitis    Varicose veins of left lower extremity     Past Surgical History:  Procedure Laterality Date   BARTHOLIN CYST MARSUPIALIZATION  12/27/2014   BASAL CELL CARCINOMA EXCISION  01/30/14, 09/28/14, 04/01/18, 01/01/17   Left Side of Face, Two on Back, Left just above upper Lip   CATARACT EXTRACTION  12/12/13, 01/30/14   Left Eye and Right Eye with implants   EXCISION NEUROMA     EYE SURGERY     FINGER SURGERY     Surgical release of adhesion of right little finger   hemithyroidectomy     HERNIA REPAIR     morton's neuroma/ neurectomy Left 02/06/2009   SCLEROTHERAPY     varicose veins left and right   THYROID LOBECTOMY     VEIN SURGERY     Left and Right legs   WISDOM TOOTH EXTRACTION      Social History   Socioeconomic History   Marital status: Married    Spouse name: Not on file   Number of children: Not on file   Years of education: Not on file   Highest education level: Not on file  Occupational History   Not on file  Tobacco Use   Smoking status: Never   Smokeless tobacco: Never  Vaping Use   Vaping Use: Never used  Substance and Sexual Activity   Alcohol use: Yes    Alcohol/week:  0.0 standard drinks    Comment: 5 Glasses of Wine   Drug use: No   Sexual activity: Not Currently  Other Topics Concern   Not on file  Social History Narrative   Diet:  General/ Heart Healthy       Do you drink/ eat things with caffeine?  1-2 cups coffee in am (usually 1/2 caff strength)       Marital status:  Married                             What year were you married ? 1981      Do you live in a house, apartment,assistred living, condo, trailer, etc.)? House      Is it one or more stories?  2 Story      How many persons live in your home ? 2      Do you have any pets in your home ?(please list) None      Highest Level of education completed: BSN / [employer]       Current or  past profession: Retired Therapist, sports      Do you exercise?  Yes                            Type & how often  Daily 3 mile fast Pace walk / Every other day: Strength Training (not consistent, yet)       ADVANCED DIRECTIVES (Please bring copies)      Do you have a living will? Yes      Do you have a DNR form?  No                     If not, do you want to discuss one?       Do you have signed POA?HPOA forms?  Yes               If so, please bring to your appointment      FUNCTIONAL STATUS- To be completed by Spouse / child / Staff       Do you have difficulty bathing or dressing yourself ? No       Do you have difficulty preparing food or eating ? No      Do you have difficulty managing your mediation ? No      Do you have difficulty managing your finances ? No      Do you have difficulty affording your medication ? No      Social Determinants of Health   Financial Resource Strain: Not on file  Food Insecurity: Not on file  Transportation Needs: Not on file  Physical Activity: Not on file  Stress: Not on file  Social Connections: Not on file  Intimate Partner Violence: Not on file   Family History  Problem Relation Age of Onset   Breast cancer Maternal Aunt        20's    Dementia Mother 57       Related to Paekinson's   Osteoporosis Mother    Thyroid disease Mother    Pneumonia Father    Dementia Father 64   Hypertension Father    Arthritis Brother    Depression Sister    Psoriasis Brother    Asthma Son    Allergies Son    Stroke Maternal Grandmother    Coronary artery disease Maternal Grandfather    Coronary artery disease Paternal Grandfather       VITAL SIGNS BP (!) 148/60    Pulse 84    Temp (!) 96.9 F (36.1 C)    Resp 16    Ht 5\' 7"  (1.702 m)    Wt 126 lb 6.4 oz (57.3 kg)    LMP 10/11/1998    SpO2 99%    BMI 19.80 kg/m   Outpatient Encounter Medications as of 06/26/2021  Medication Sig   acetaminophen (TYLENOL) 650 MG CR tablet Take 650 mg by mouth 2 (two) times daily.   Calcium Citrate-Vitamin D (CITRACAL + D PO) Take 1 tablet by mouth daily. Strength / 400 mg/12.5 mg   cetirizine (ZYRTEC) 10 MG tablet Take 10 mg by mouth daily.    cyanocobalamin 1000 MCG tablet Take 1,000 mcg by mouth daily.   Melatonin 10 MG TABS Take 1 tablet by mouth at bedtime as needed.   meloxicam (MOBIC) 7.5 MG tablet Take 7.5 mg by mouth daily.   Multiple Vitamins-Minerals (CENTRUM SILVER ULTRA WOMENS PO) Take 1 tablet by mouth daily.   Turmeric 500 MG  CAPS Take 500 mg by mouth daily.   valACYclovir (VALTREX) 1000 MG tablet TAKE 1 TABLET (1,000 MG TOTAL) BY MOUTH AS NEEDED. FOR COLD SORE MANAGEMENT   Wheat Dextrin (BENEFIBER PO) Take 1 tablet by mouth daily.   [DISCONTINUED] GRAPE SEED EXTRACT PO Take 400 mg by mouth daily.    [DISCONTINUED] meloxicam (MOBIC) 7.5 MG tablet Take 1 tablet (7.5 mg total) by mouth daily as needed.   No facility-administered encounter medications on file as of 06/26/2021.     SIGNIFICANT DIAGNOSTIC EXAMS  Review of Systems  Constitutional:  Negative for malaise/fatigue.  Respiratory:  Negative for cough and shortness of breath.   Cardiovascular:  Positive for palpitations. Negative for chest pain and leg swelling.       Will  wake up at night due to irregular heart beat   Gastrointestinal:  Negative for abdominal pain, constipation and heartburn.  Musculoskeletal:  Negative for back pain, joint pain and myalgias.  Skin: Negative.   Neurological:  Negative for dizziness.  Psychiatric/Behavioral:  The patient is not nervous/anxious.    Physical Exam Constitutional:      General: She is not in acute distress.    Appearance: She is well-developed. She is not diaphoretic.  Neck:     Thyroid: No thyromegaly.  Cardiovascular:     Rate and Rhythm: Normal rate and regular rhythm.     Pulses: Normal pulses.     Heart sounds: Normal heart sounds.     Comments: She is having pvc's in bursts up to 4 at time of pvc's. She has had a couple of beats without visible p wave present.  Pulmonary:     Effort: Pulmonary effort is normal. No respiratory distress.     Breath sounds: Normal breath sounds.  Abdominal:     General: Bowel sounds are normal. There is no distension.     Palpations: Abdomen is soft.     Tenderness: There is no abdominal tenderness.  Musculoskeletal:        General: Normal range of motion.     Cervical back: Neck supple.     Right lower leg: No edema.     Left lower leg: No edema.  Lymphadenopathy:     Cervical: No cervical adenopathy.  Skin:    General: Skin is warm and dry.  Neurological:     Mental Status: She is alert and oriented to person, place, and time.  Psychiatric:        Mood and Affect: Mood normal.       ASSESSMENT/ PLAN:  TODAY   Palpitations 2. Bi and trigeminy  Will check cbc; cmp; tsh b 12; will setup consult with Dr. Donnelly Angelica with [facility] [place] NP [facility]   [phone]

## 2021-06-27 ENCOUNTER — Ambulatory Visit
Admission: RE | Admit: 2021-06-27 | Discharge: 2021-06-27 | Disposition: A | Discharge status: Home / Self Care | Payer: Medicare Other | Source: Ambulatory Visit | Attending: Nurse Practitioner | Admitting: Nurse Practitioner

## 2021-06-27 DIAGNOSIS — Z1231 Encounter for screening mammogram for malignant neoplasm of breast: Secondary | ICD-10-CM | POA: Diagnosis not present

## 2021-06-27 DIAGNOSIS — E2839 Other primary ovarian failure: Secondary | ICD-10-CM | POA: Insufficient documentation

## 2021-06-27 LAB — COMPREHENSIVE METABOLIC PANEL
AG Ratio: 2 (calc) (ref 1.0–2.5)
ALT: 19 U/L (ref 6–29)
AST: 23 U/L (ref 10–35)
Albumin: 5 g/dL (ref 3.6–5.1)
Alkaline phosphatase (APISO): 68 U/L (ref 37–153)
BUN: 10 mg/dL (ref 7–25)
CO2: 31 mmol/L (ref 20–32)
Calcium: 9.8 mg/dL (ref 8.6–10.4)
Chloride: 99 mmol/L (ref 98–110)
Creat: 0.66 mg/dL (ref 0.50–1.05)
Globulin: 2.5 g/dL (calc) (ref 1.9–3.7)
Glucose, Bld: 98 mg/dL (ref 65–139)
Potassium: 4.1 mmol/L (ref 3.5–5.3)
Sodium: 137 mmol/L (ref 135–146)
Total Bilirubin: 0.5 mg/dL (ref 0.2–1.2)
Total Protein: 7.5 g/dL (ref 6.1–8.1)

## 2021-06-27 LAB — CBC
HCT: 36.6 % (ref 35.0–45.0)
Hemoglobin: 12.1 g/dL (ref 11.7–15.5)
MCH: 32 pg (ref 27.0–33.0)
MCHC: 33.1 g/dL (ref 32.0–36.0)
MCV: 96.8 fL (ref 80.0–100.0)
MPV: 11.8 fL (ref 7.5–12.5)
Platelets: 286 10*3/uL (ref 140–400)
RBC: 3.78 10*6/uL — ABNORMAL LOW (ref 3.80–5.10)
RDW: 11 % (ref 11.0–15.0)
WBC: 4.1 10*3/uL (ref 3.8–10.8)

## 2021-06-27 LAB — TSH: TSH: 4.03 mIU/L (ref 0.40–4.50)

## 2021-06-27 LAB — VITAMIN B12: Vitamin B-12: 1469 pg/mL — ABNORMAL HIGH (ref 200–1100)

## 2021-08-18 ENCOUNTER — Other Ambulatory Visit: Payer: Self-pay | Admitting: Adult Health

## 2021-11-07 HISTORY — PX: RIGHT HEART CATH: SHX6075

## 2021-12-11 ENCOUNTER — Encounter: Payer: Self-pay | Admitting: Nurse Practitioner

## 2021-12-11 NOTE — Progress Notes (Unsigned)
   This service is provided via telemedicine  No vital signs collected/recorded due to the encounter was a telemedicine visit.   Location of patient (ex: home, work):  Home  Patient consents to a telephone visit: Yes, see telephone visit dated 12/11/21  Location of the provider (ex: office, home):  [place], [place] Location   Name of any referring provider:  N/A  Names of all persons participating in the telemedicine service and their role in the encounter:  S.Chrae B/CMA, Sherrie Mustache, NP, and Patient   Time spent on call:  11 min with medical assistant

## 2021-12-12 ENCOUNTER — Encounter: Payer: Self-pay | Admitting: Nurse Practitioner

## 2021-12-12 ENCOUNTER — Ambulatory Visit (INDEPENDENT_AMBULATORY_CARE_PROVIDER_SITE_OTHER): Payer: Medicare Other | Admitting: Nurse Practitioner

## 2021-12-12 ENCOUNTER — Telehealth: Payer: Self-pay

## 2021-12-12 DIAGNOSIS — Z0000 Encounter for general adult medical examination without abnormal findings: Secondary | ICD-10-CM

## 2021-12-12 NOTE — Progress Notes (Signed)
Subjective:   Mia Cruz is a 66 y.o. female who presents for Medicare Annual (Subsequent) preventive examination.  Review of Systems           Objective:    There were no vitals filed for this visit. There is no height or weight on file to calculate BMI.     12/11/2021    4:57 PM 03/25/2021    8:16 AM 03/29/2020    7:37 AM 03/19/2020    1:50 PM 03/09/2020    3:49 PM 08/20/2016    9:14 AM  Advanced Directives  Does Patient Have a Medical Advance Directive? Yes Yes Yes Yes Yes Yes  Type of Paramedic of [place];Living will [place];Living will Healthcare Power of [place]   Does patient want to make changes to medical advance directive? No - Patient declined No - Patient declined No - Patient declined No - Patient declined No - Patient declined   Copy of [place] in Chart? Yes - validated most recent copy scanned in chart (See row information) Yes - validated most recent copy scanned in chart (See row information)   Yes - validated most recent copy scanned in chart (See row information)     Current Medications (verified) Outpatient Encounter Medications as of 12/12/2021  Medication Sig   Bioflavonoid Products (GRAPE SEED PO) Take 400 mg by mouth daily.   Calcium Citrate-Vitamin D (CITRACAL + D PO) Take 1 tablet by mouth daily. Strength / 400 mg/12.5 mg   cetirizine (ZYRTEC) 10 MG tablet Take 10 mg by mouth daily.    diltiazem (DILACOR XR) 120 MG 24 hr capsule Take 120 mg by mouth daily.   folic acid (FOLVITE) 433 MCG tablet Take 400 mcg by mouth daily.   ibuprofen (ADVIL) 400 MG tablet Take 400 mg by mouth as needed.   Melatonin 10 MG TABS Take 1 tablet by mouth at bedtime as needed.   meloxicam (MOBIC) 7.5 MG tablet Take 7.5 mg by mouth as needed.   methotrexate 2.5 MG tablet Take 12.5 mg by mouth once a week. Caution:Chemotherapy. Protect from light. Total  of 5 tablets on Sunday   Multiple Vitamins-Minerals (CENTRUM SILVER ULTRA WOMENS PO) Take 1 tablet by mouth daily.   pseudoephedrine (SUDAFED) 30 MG tablet Take 30 mg by mouth as needed for congestion.   valACYclovir (VALTREX) 1000 MG tablet TAKE 1 TABLET (1,000 MG TOTAL) BY MOUTH AS NEEDED. FOR COLD SORE MANAGEMENT   Wheat Dextrin (BENEFIBER PO) Take 1 tablet by mouth daily.   [DISCONTINUED] acetaminophen (TYLENOL) 650 MG CR tablet Take 650 mg by mouth 2 (two) times daily.   [DISCONTINUED] cyanocobalamin 1000 MCG tablet Take 1,000 mcg by mouth daily.   [DISCONTINUED] metoprolol succinate (TOPROL-XL) 25 MG 24 hr tablet TAKE 1/2 TABLET BY MOUTH EVERY DAY   [DISCONTINUED] Turmeric 500 MG CAPS Take 500 mg by mouth daily.   No facility-administered encounter medications on file as of 12/12/2021.    Allergies (verified) Penicillins and Hydroxychloroquine   History: Past Medical History:  Diagnosis Date   Amenorrhea    BCC (basal cell carcinoma), leg    Cancer (HCC)    skin   Cataract    Osteoarthritis of hands, bilateral    Ovarian cyst    Raynaud disease    Tendonitis    Varicose veins of left lower extremity    Past Surgical History:  Procedure Laterality Date  BARTHOLIN CYST MARSUPIALIZATION  12/27/2014   BASAL CELL CARCINOMA EXCISION  01/30/14, 09/28/14, 04/01/18, 01/01/17   Left Side of Face, Two on Back, Left just above upper Lip   CATARACT EXTRACTION  12/12/13, 01/30/14   Left Eye and Right Eye with implants   EXCISION NEUROMA     EYE SURGERY     FINGER SURGERY     Surgical release of adhesion of right little finger   hemithyroidectomy     HERNIA REPAIR     morton's neuroma/ neurectomy Left 02/06/2009   RIGHT HEART CATH  11/07/2021   SCLEROTHERAPY     varicose veins left and right   THYROID LOBECTOMY     VEIN SURGERY     Left and Right legs   WISDOM TOOTH EXTRACTION     Family History  Problem Relation Age of Onset   Breast cancer Maternal Aunt        89's    Dementia Mother 27       Related to Paekinson's   Osteoporosis Mother    Thyroid disease Mother    Pneumonia Father    Dementia Father 36   Hypertension Father    Arthritis Brother    Depression Sister    Psoriasis Brother    Asthma Son    Allergies Son    Stroke Maternal Grandmother    Coronary artery disease Maternal Grandfather    Coronary artery disease Paternal Grandfather    Social History   Socioeconomic History   Marital status: Married    Spouse name: Not on file   Number of children: Not on file   Years of education: Not on file   Highest education level: Not on file  Occupational History   Not on file  Tobacco Use   Smoking status: Never   Smokeless tobacco: Never  Vaping Use   Vaping Use: Never used  Substance and Sexual Activity   Alcohol use: Yes    Comment: 1 Glasses of Wine   Drug use: No   Sexual activity: Not Currently  Other Topics Concern   Not on file  Social History Narrative   Diet:  General/ Heart Healthy       Do you drink/ eat things with caffeine?  1-2 cups coffee in am (usually 1/2 caff strength)       Marital status:  Married                             What year were you married ? 1981      Do you live in a house, apartment,assistred living, condo, trailer, etc.)? House      Is it one or more stories?  2 Story      How many persons live in your home ? 2      Do you have any pets in your home ?(please list) None      Highest Level of education completed: BSN / [place]       Current or past profession: Retired Therapist, sports      Do you exercise?  Yes                            Type & how often  Daily 3 mile fast Pace walk / Every other day: Strength Training (not consistent, yet)       ADVANCED DIRECTIVES (Please bring copies)  Do you have a living will? Yes      Do you have a DNR form?  No                     If not, do you want to discuss one?       Do you have signed POA?HPOA forms?  Yes               If so,  please bring to your appointment      FUNCTIONAL STATUS- To be completed by Spouse / child / Staff       Do you have difficulty bathing or dressing yourself ? No       Do you have difficulty preparing food or eating ? No      Do you have difficulty managing your mediation ? No      Do you have difficulty managing your finances ? No      Do you have difficulty affording your medication ? No      Social Determinants of Health   Financial Resource Strain: Not on file  Food Insecurity: Not on file  Transportation Needs: Not on file  Physical Activity: Not on file  Stress: Not on file  Social Connections: Not on file    Tobacco Counseling Counseling given: Not Answered   Clinical Intake:                 Diabetic?no         Activities of Daily Living     No data to display          Patient Care Team: Lauree Chandler, NP as PCP - General (Geriatric Medicine) Edrick Kins, DPM as Consulting Physician (Podiatry) Benjaman Kindler, MD as Consulting Physician (Obstetrics and Gynecology) Emmaline Kluver., MD (Rheumatology) Oneta Rack, MD (Dermatology) Thelma Comp, OD (Optometry)  Indicate any recent Medical Services you may have received from other than [facility] providers in the past year (date may be approximate).     Assessment:   This is a routine wellness examination for Mia Cruz.  Hearing/Vision screen Hearing Screening - Comments:: No hearing issues  Vision Screening - Comments:: Last eye exam greater than 12 months ago. Dr.Bulakowski, [facility]  Dietary issues and exercise activities discussed:     Goals Addressed   None    Depression Screen    12/12/2021    7:57 AM 03/25/2021    8:31 AM 03/29/2020    8:13 AM 03/19/2020    1:50 PM  PHQ 2/9 Scores  PHQ - 2 Score 0 0 0 0    Fall Risk    12/12/2021    7:57 AM 06/26/2021   11:36 AM 03/25/2021    8:31 AM 03/29/2020    8:13 AM 03/29/2020    7:37 AM  Fall Risk    Falls in the past year? 0 0 0 0 0  Number falls in past yr: 0 0 0 0 0  Injury with Fall? 0 0 0 0 0  Risk for fall due to : No Fall Risks No Fall Risks No Fall Risks    Follow up Falls evaluation completed Falls evaluation completed Falls evaluation completed      FALL RISK PREVENTION PERTAINING TO THE HOME:  Any stairs in or around the home? Yes  If so, are there any without handrails? No  Home free of loose throw rugs in walkways, pet beds, electrical cords, etc? Yes  Adequate  lighting in your home to reduce risk of falls? Yes   ASSISTIVE DEVICES UTILIZED TO PREVENT FALLS:  Life alert? No  Use of a cane, walker or w/c? No  Grab bars in the bathroom? Yes  Shower chair or bench in shower? No  Elevated toilet seat or a handicapped toilet? No   TIMED UP AND GO:  Was the test performed? No .    Cognitive Function:        12/12/2021    8:27 AM  6CIT Screen  What Year? 0 points  What month? 0 points  What time? 0 points  Count back from 20 0 points  Months in reverse 0 points  Repeat phrase 0 points  Total Score 0 points    Immunizations Immunization History  Administered Date(s) Administered   Fluad Quad(high Dose 65+) 02/06/2021   Influenza, High Dose Seasonal PF 01/17/2020, 02/06/2021   Influenza-Unspecified 02/19/2016, 02/06/2017, 01/29/2019   PFIZER Comirnaty(Gray Top)Covid-19 Tri-Sucrose Vaccine 07/26/2019, 08/16/2019, 02/20/2020   PFIZER(Purple Top)SARS-COV-2 Vaccination 08/16/2019, 09/06/2020   PNEUMOCOCCAL CONJUGATE-20 08/13/2020   Pfizer Covid-19 Vaccine Bivalent Booster 62yr & up 02/06/2021   Pneumococcal Polysaccharide-23 03/03/2019   Tdap 05/15/2017   Zoster Recombinat (Shingrix) 03/12/2019, 06/17/2019   Zoster, Live 02/18/2015    TDAP status: Up to date  Flu Vaccine status: Up to date  Pneumococcal vaccine status: Up to date  Covid-19 vaccine status: Information provided on how to obtain vaccines.   Qualifies for Shingles Vaccine? Yes    Zostavax completed Yes   Shingrix Completed?: No.    Education has been provided regarding the importance of this vaccine. Patient has been advised to call insurance company to determine out of pocket expense if they have not yet received this vaccine. Advised may also receive vaccine at local pharmacy or Health Dept. Verbalized acceptance and understanding.  Screening Tests Health Maintenance  Topic Date Due   INFLUENZA VACCINE  12/10/2021   MAMMOGRAM  06/28/2023   COLONOSCOPY (Pts 45-455yrInsurance coverage will need to be confirmed)  08/21/2026   TETANUS/TDAP  05/16/2027   Pneumonia Vaccine 6528Years old  Completed   DEXA SCAN  Completed   COVID-19 Vaccine  Completed   Hepatitis C Screening  Completed   Zoster Vaccines- Shingrix  Completed   HPV VACCINES  Aged Out    Health Maintenance  Health Maintenance Due  Topic Date Due   INFLUENZA VACCINE  12/10/2021    Colorectal cancer screening: Type of screening: Colonoscopy. Completed 2018. Repeat every 10 years  Mammogram status: Completed 2/23. Repeat every year  Bone Density status: Completed 2/23. Results reflect: Bone density results: OSTEOPENIA. Repeat every 2 years.  Lung Cancer Screening: (Low Dose CT Chest recommended if Age 67-80ears, 30 pack-year currently smoking OR have quit w/in 15years.) does not qualify.   Lung Cancer Screening Referral: na  Additional Screening:  Hepatitis C Screening: does qualify; Completed 2021  Vision Screening: Recommended annual ophthalmology exams for early detection of glaucoma and other disorders of the eye. Is the patient up to date with their annual eye exam?  Yes  Who is the provider or what is the name of the office in which the patient attends annual eye exams[facility] provider, would they like to be referred to a provider to establish care? No .   Dental Screening: Recommended annual dental exams for proper oral  hygiene  Community Resource Referral / Chronic Care Management: CRR required this visit?  No   CCM required this visit?  No      Plan:     I have personally reviewed and noted the following in the patient's chart:   Medical and social history Use of alcohol, tobacco or illicit drugs  Current medications and supplements including opioid prescriptions.  Functional ability and status Nutritional status Physical activity Advanced directives List of other physicians Hospitalizations, surgeries, and ER visits in previous 12 months Vitals Screenings to include cognitive, depression, and falls Referrals and appointments  In addition, I have reviewed and discussed with patient certain preventive protocols, quality metrics, and best practice recommendations. A written personalized care plan for preventive services as well as general preventive health recommendations were provided to patient.     Lauree Chandler, NP   12/12/2021    Virtual Visit via Telephone Note  I connected with patient 12/12/21 at  8:40 AM EDT by telephone and verified that I am speaking with the correct person using two identifiers.  Location: Patient: home Provider: [facility]   I discussed the limitations, risks, security and privacy concerns of performing an evaluation and management service by telephone and the availability of in person appointments. I also discussed with the patient that there may be a patient responsible charge related to this service. The patient expressed understanding and agreed to proceed.   I discussed the assessment and treatment plan with the patient. The patient was provided an opportunity to ask questions and all were answered. The patient agreed with the plan and demonstrated an understanding of the instructions.   The patient was advised to call back or seek an in-person evaluation if the symptoms worsen or if the condition fails to improve as anticipated.  I provided 14  minutes of non-face-to-face time during this encounter.  [employer]. Harle Battiest Avs printed and mailed

## 2021-12-12 NOTE — Patient Instructions (Signed)
[name] , Thank you for taking time to come for your Medicare Wellness Visit. I appreciate your ongoing commitment to your health goals. Please review the following plan we discussed and let me know if I can assist you in the future.   Screening recommendations/referrals: Colonoscopy up to date Mammogram up to date Bone Density up to date Recommended yearly ophthalmology/optometry visit for glaucoma screening and checkup Recommended yearly dental visit for hygiene and checkup  Vaccinations: Influenza vaccine up to date Pneumococcal vaccine up to date Tdap vaccine up to date Shingles vaccine up to date    Advanced directives: on file.   Conditions/risks identified: advanced age.  Next appointment: yearly.    Preventive Care 67 Years and Older, Female Preventive care refers to lifestyle choices and visits with your health care provider that can promote health and wellness. What does preventive care include? A yearly physical exam. This is also called an annual well check. Dental exams once or twice a year. Routine eye exams. Ask your health care provider how often you should have your eyes checked. Personal lifestyle choices, including: Daily care of your teeth and gums. Regular physical activity. Eating a healthy diet. Avoiding tobacco and drug use. Limiting alcohol use. Practicing safe sex. Taking low-dose aspirin every day. Taking vitamin and mineral supplements as recommended by your health care provider. What happens during an annual well check? The services and screenings done by your health care provider during your annual well check will depend on your age, overall health, lifestyle risk factors, and family history of disease. Counseling  Your health care provider may ask you questions about your: Alcohol use. Tobacco use. Drug use. Emotional well-being. Home and relationship well-being. Sexual activity. Eating habits. History of falls. Memory and ability to  understand (cognition). Work and work Statistician. Reproductive health. Screening  You may have the following tests or measurements: Height, weight, and BMI. Blood pressure. Lipid and cholesterol levels. These may be checked every 5 years, or more frequently if you are over 30 years old. Skin check. Lung cancer screening. You may have this screening every year starting at age 67 if you have a 30-pack-year history of smoking and currently smoke or have quit within the past 15 years. Fecal occult blood test (FOBT) of the stool. You may have this test every year starting at age 67. Flexible sigmoidoscopy or colonoscopy. You may have a sigmoidoscopy every 5 years or a colonoscopy every 10 years starting at age 59. Hepatitis C blood test. Hepatitis B blood test. Sexually transmitted disease (STD) testing. Diabetes screening. This is done by checking your blood sugar (glucose) after you have not eaten for a while (fasting). You may have this done every 1-3 years. Bone density scan. This is done to screen for osteoporosis. You may have this done starting at age 67. Mammogram. This may be done every 1-2 years. Talk to your health care provider about how often you should have regular mammograms. Talk with your health care provider about your test results, treatment options, and if necessary, the need for more tests. Vaccines  Your health care provider may recommend certain vaccines, such as: Influenza vaccine. This is recommended every year. Tetanus, diphtheria, and acellular pertussis (Tdap, Td) vaccine. You may need a Td booster every 10 years. Zoster vaccine. You may need this after age 67. Pneumococcal 13-valent conjugate (PCV13) vaccine. One dose is recommended after age 67. Pneumococcal polysaccharide (PPSV23) vaccine. One dose is recommended after age 67. Talk to your health care  provider about which screenings and vaccines you need and how often you need them. This information is not  intended to replace advice given to you by your health care provider. Make sure you discuss any questions you have with your health care provider. Document Released: 05/25/2015 Document Revised: 01/16/2016 Document Reviewed: 02/27/2015 Elsevier Interactive Patient Education  2017 [place] Prevention in the Home Falls can cause injuries. They can happen to people of all ages. There are many things you can do to make your home safe and to help prevent falls. What can I do on the outside of my home? Regularly fix the edges of walkways and driveways and fix any cracks. Remove anything that might make you trip as you walk through a door, such as a raised step or threshold. Trim any bushes or trees on the path to your home. Use bright outdoor lighting. Clear any walking paths of anything that might make someone trip, such as rocks or tools. Regularly check to see if handrails are loose or broken. Make sure that both sides of any steps have handrails. Any raised decks and porches should have guardrails on the edges. Have any leaves, snow, or ice cleared regularly. Use sand or salt on walking paths during winter. Clean up any spills in your garage right away. This includes oil or grease spills. What can I do in the bathroom? Use night lights. Install grab bars by the toilet and in the tub and shower. Do not use towel bars as grab bars. Use non-skid mats or decals in the tub or shower. If you need to sit down in the shower, use a plastic, non-slip stool. Keep the floor dry. Clean up any water that spills on the floor as soon as it happens. Remove soap buildup in the tub or shower regularly. Attach bath mats securely with double-sided non-slip rug tape. Do not have throw rugs and other things on the floor that can make you trip. What can I do in the bedroom? Use night lights. Make sure that you have a light by your bed that is easy to reach. Do not use any sheets or blankets that are  too big for your bed. They should not hang down onto the floor. Have a firm chair that has side arms. You can use this for support while you get dressed. Do not have throw rugs and other things on the floor that can make you trip. What can I do in the kitchen? Clean up any spills right away. Avoid walking on wet floors. Keep items that you use a lot in easy-to-reach places. If you need to reach something above you, use a strong step stool that has a grab bar. Keep electrical cords out of the way. Do not use floor polish or wax that makes floors slippery. If you must use wax, use non-skid floor wax. Do not have throw rugs and other things on the floor that can make you trip. What can I do with my stairs? Do not leave any items on the stairs. Make sure that there are handrails on both sides of the stairs and use them. Fix handrails that are broken or loose. Make sure that handrails are as long as the stairways. Check any carpeting to make sure that it is firmly attached to the stairs. Fix any carpet that is loose or worn. Avoid having throw rugs at the top or bottom of the stairs. If you do have throw rugs, attach them to the  floor with carpet tape. Make sure that you have a light switch at the top of the stairs and the bottom of the stairs. If you do not have them, ask someone to add them for you. What else can I do to help prevent falls? Wear shoes that: Do not have high heels. Have rubber bottoms. Are comfortable and fit you well. Are closed at the toe. Do not wear sandals. If you use a stepladder: Make sure that it is fully opened. Do not climb a closed stepladder. Make sure that both sides of the stepladder are locked into place. Ask someone to hold it for you, if possible. Clearly mark and make sure that you can see: Any grab bars or handrails. First and last steps. Where the edge of each step is. Use tools that help you move around (mobility aids) if they are needed. These  include: Canes. Walkers. Scooters. Crutches. Turn on the lights when you go into a dark area. Replace any light bulbs as soon as they burn out. Set up your furniture so you have a clear path. Avoid moving your furniture around. If any of your floors are uneven, fix them. If there are any pets around you, be aware of where they are. Review your medicines with your doctor. Some medicines can make you feel dizzy. This can increase your chance of falling. Ask your doctor what other things that you can do to help prevent falls. This information is not intended to replace advice given to you by your health care provider. Make sure you discuss any questions you have with your health care provider. Document Released: 02/22/2009 Document Revised: 10/04/2015 Document Reviewed: 06/02/2014 Elsevier Interactive Patient Education  2017 [employer].

## 2021-12-12 NOTE — Telephone Encounter (Signed)
[name], montee are scheduled for a virtual visit with your provider today.    Just as we do with appointments in the office, we must obtain your consent to participate.  Your consent will be active for this visit and any virtual visit you may have with one of our providers in the next 365 days.    If you have a MyChart account, I can also send a copy of this consent to you electronically.  All virtual visits are billed to your insurance company just like a traditional visit in the office.  As this is a virtual visit, video technology does not allow for your provider to perform a traditional examination.  This may limit your provider's ability to fully assess your condition.  If your provider identifies any concerns that need to be evaluated in person or the need to arrange testing such as labs, EKG, etc, we will make arrangements to do so.    Although advances in technology are sophisticated, we cannot ensure that it will always work on either your end or our end.  If the connection with a video visit is poor, we may have to switch to a telephone visit.  With either a video or telephone visit, we are not always able to ensure that we have a secure connection.   I need to obtain your verbal consent now.   Are you willing to proceed with your visit today?   Mia Cruz has provided verbal consent on 12/12/2021 for a virtual visit (video or telephone).   [name], [place] 12/12/2021  8:10 am

## 2021-12-13 ENCOUNTER — Encounter: Payer: Medicare Other | Admitting: Nurse Practitioner

## 2022-03-28 ENCOUNTER — Ambulatory Visit: Payer: Medicare Other | Admitting: Nurse Practitioner

## 2022-04-12 ENCOUNTER — Other Ambulatory Visit: Payer: Self-pay | Admitting: Nurse Practitioner

## 2022-04-12 DIAGNOSIS — M19041 Primary osteoarthritis, right hand: Secondary | ICD-10-CM

## 2022-06-24 ENCOUNTER — Other Ambulatory Visit: Payer: Self-pay | Admitting: Obstetrics and Gynecology

## 2022-06-24 DIAGNOSIS — Z1231 Encounter for screening mammogram for malignant neoplasm of breast: Secondary | ICD-10-CM

## 2022-07-10 ENCOUNTER — Ambulatory Visit
Admission: RE | Admit: 2022-07-10 | Discharge: 2022-07-10 | Disposition: A | Discharge status: Home / Self Care | Payer: Medicare Other | Source: Ambulatory Visit | Attending: Obstetrics and Gynecology | Admitting: Obstetrics and Gynecology

## 2022-07-10 DIAGNOSIS — Z1231 Encounter for screening mammogram for malignant neoplasm of breast: Secondary | ICD-10-CM

## 2022-08-19 ENCOUNTER — Encounter: Payer: Self-pay | Admitting: Podiatry

## 2022-08-19 ENCOUNTER — Ambulatory Visit (INDEPENDENT_AMBULATORY_CARE_PROVIDER_SITE_OTHER): Payer: Medicare Other

## 2022-08-19 ENCOUNTER — Ambulatory Visit (INDEPENDENT_AMBULATORY_CARE_PROVIDER_SITE_OTHER): Payer: Medicare Other | Admitting: Podiatry

## 2022-08-19 VITALS — BP 154/76 | HR 74

## 2022-08-19 DIAGNOSIS — M25872 Other specified joint disorders, left ankle and foot: Secondary | ICD-10-CM | POA: Diagnosis not present

## 2022-08-19 DIAGNOSIS — M2021 Hallux rigidus, right foot: Secondary | ICD-10-CM | POA: Diagnosis not present

## 2022-08-19 DIAGNOSIS — M25871 Other specified joint disorders, right ankle and foot: Secondary | ICD-10-CM | POA: Diagnosis not present

## 2022-08-19 DIAGNOSIS — M2022 Hallux rigidus, left foot: Secondary | ICD-10-CM | POA: Diagnosis not present

## 2022-08-19 MED ORDER — BETAMETHASONE SOD PHOS & ACET 6 (3-3) MG/ML IJ SUSP
3.0000 mg | Freq: Once | INTRAMUSCULAR | Status: AC
  Administered 2022-08-19: 3 mg via INTRA_ARTICULAR

## 2022-08-19 NOTE — Progress Notes (Signed)
Chief Complaint  Patient presents with   Toe Pain    "My main concern is my big toes, especially the left foot." N - toe joint pain L - 1st met plantar and bunion bilateral, left > right D - beginning of this year O - gradually worse C - sore, throbs at night, constant ache A - pressure  T - Ibuprofen, wear orthotics -67 yrs old     HPI: 67 y.o. female presenting today as a reestablish new patient for evaluation of bilateral great toe pain.  She says that she was recently diagnosed with rheumatoid arthritis and is concerned that possibly she is developing arthritis to the big toe joint.  Ongoing for about 4-5 months now.  Aggravated with hiking.  She takes ibuprofen as needed and wears custom molded orthotics with a metatarsal pad.  Past Medical History:  Diagnosis Date   Amenorrhea    BCC (basal cell carcinoma), leg    Cancer    skin   Cataract    Osteoarthritis of hands, bilateral    Ovarian cyst    Raynaud disease    Tendonitis    Varicose veins of left lower extremity     Past Surgical History:  Procedure Laterality Date   BARTHOLIN CYST MARSUPIALIZATION  12/27/2014   BASAL CELL CARCINOMA EXCISION  01/30/14, 09/28/14, 04/01/18, 01/01/17   Left Side of Face, Two on Back, Left just above upper Lip   CATARACT EXTRACTION  12/12/13, 01/30/14   Left Eye and Right Eye with implants   EXCISION NEUROMA     EYE SURGERY     FINGER SURGERY     Surgical release of adhesion of right little finger   hemithyroidectomy     HERNIA REPAIR     morton's neuroma/ neurectomy Left 02/06/2009   RIGHT HEART CATH  11/07/2021   SCLEROTHERAPY     varicose veins left and right   THYROID LOBECTOMY     VEIN SURGERY     Left and Right legs   WISDOM TOOTH EXTRACTION      Allergies  Allergen Reactions   Penicillins Anaphylaxis   Hydroxychloroquine Other (See Comments)     Physical Exam: General: The patient is alert and oriented x3 in no acute distress.  Dermatology: Skin is warm, dry and  supple bilateral lower extremities. Negative for open lesions or macerations.  Vascular: Palpable pedal pulses bilaterally. Capillary refill within normal limits.  Negative for any significant edema or erythema  Neurological: Light touch and protective threshold grossly intact  Musculoskeletal Exam: High arches noted.  Pain with palpation to the sesamoidal apparatus of the bilateral great toes left greater than the right  Radiographic Exam B/L feet 08/19/2022:  Normal osseous mineralization. Joint spaces preserved.  Bipartite sesamoids noted bilateral  Assessment: 1.  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  X-rays reviewed -Injection of 0.5 cc Celestone Soluspan injected into the sesamoidal apparatus bilateral -Patient has meloxicam at home.  Recommend meloxicam 15 mg daily x 3 weeks -Continue custom molded orthotics with metatarsal pad.  Advised against going barefoot -Recommend stiff soled shoes that do not allow much flex in the forefoot -Offloading felt dancers pads were also provided for the patient to apply to any other shoes that she wears without orthotics.  To offload pressure from the first MTP -Return to clinic as needed  *Recommended going to the [employer], [place], for family vacation to see the wild horses     Felecia Shelling, DPM  [facility]  Dr. Felecia Shelling, DPM    [address] [place], [place] [address]                Office [phone]  Fax [phone]

## 2022-10-13 IMAGING — MG MM DIGITAL SCREENING BILAT W/ TOMO AND CAD
8 series · 9 of 24 positions shown · non-contrast
Comparison: Previous exam(s).

CLINICAL DATA: Screening.

EXAM:
DIGITAL SCREENING BILATERAL MAMMOGRAM WITH TOMOSYNTHESIS AND CAD
TECHNIQUE: Bilateral screening digital craniocaudal and mediolateral oblique
mammograms were obtained. Bilateral screening digital breast
tomosynthesis was performed. The images were evaluated with
computer-aided detection.

[R MLO synth-2D]
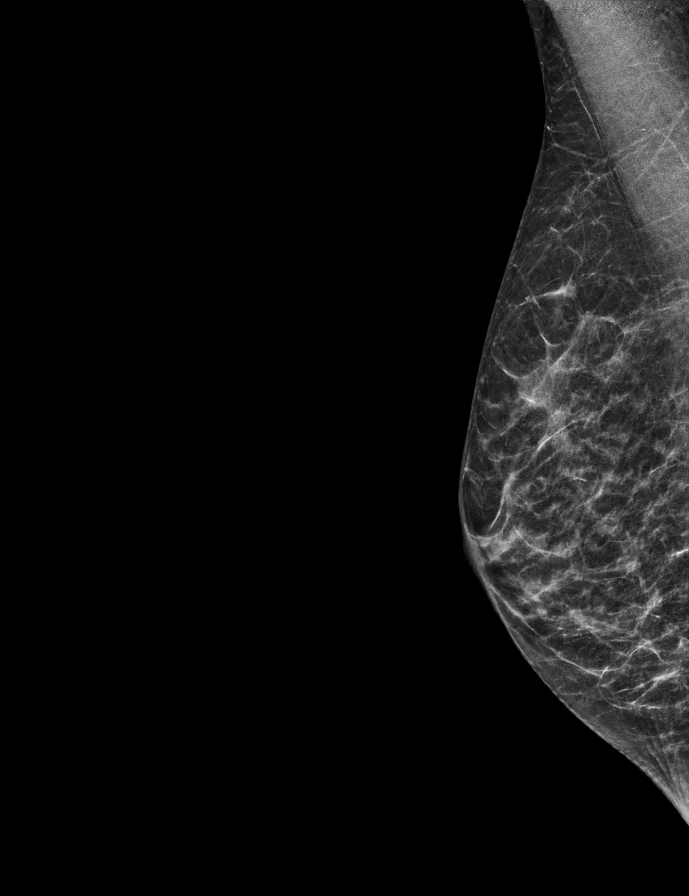

[L CC synth-2D]
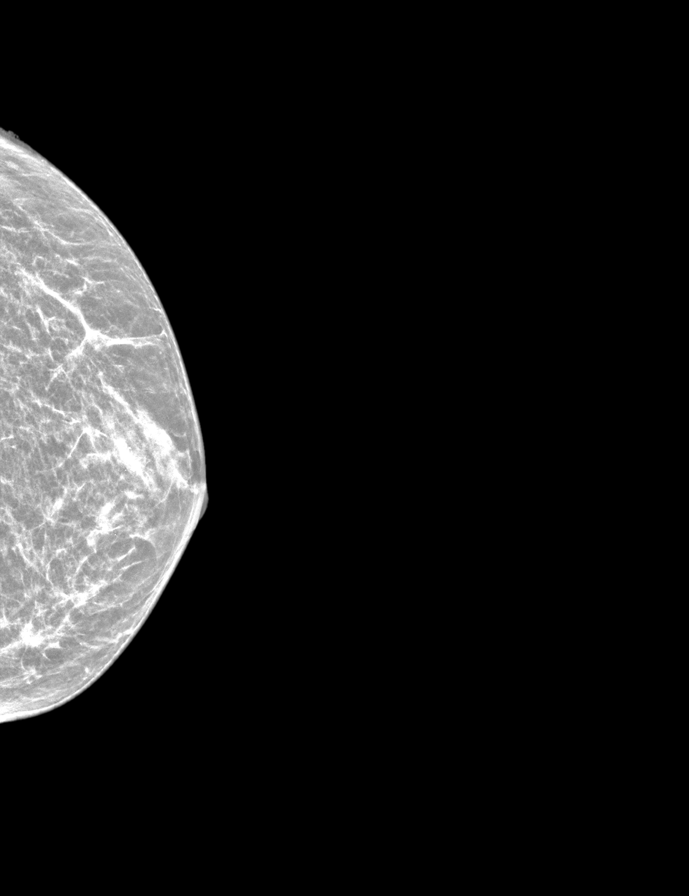

[L MLO synth-2D]
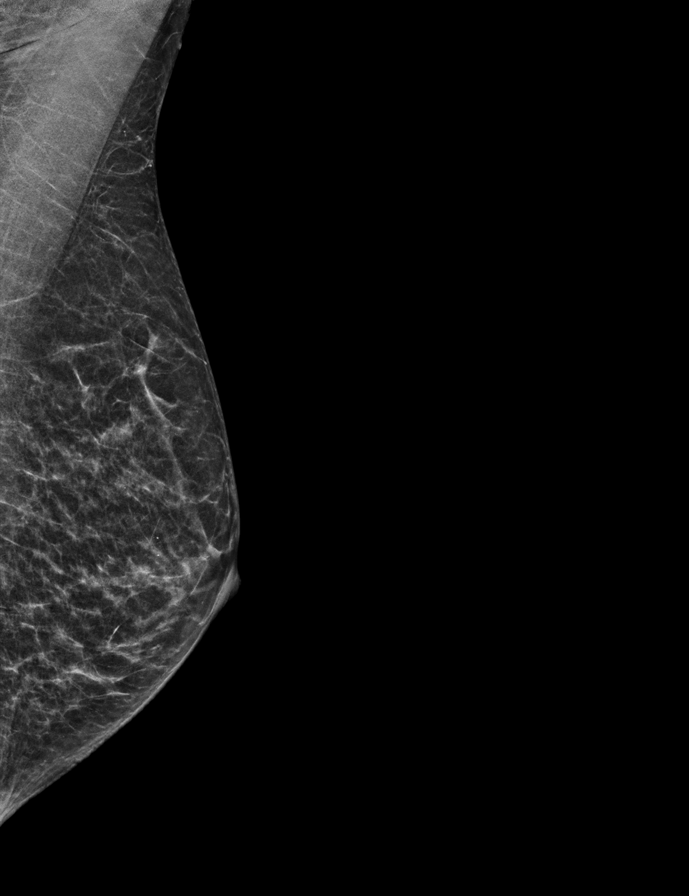

[R CC synth-2D]
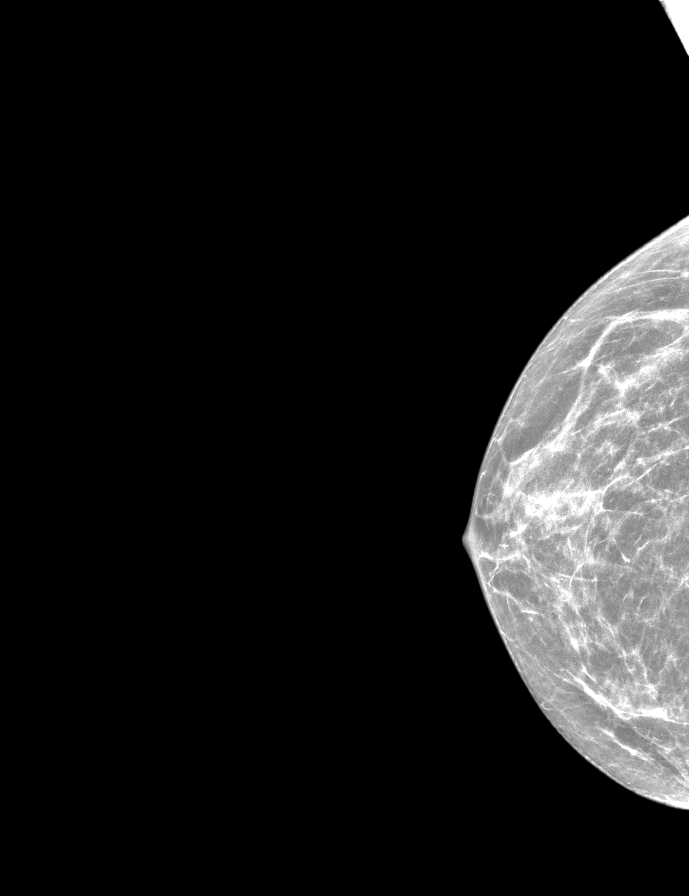

[L CC tomo · 2 of 39 frames shown]
[frame 13/39]
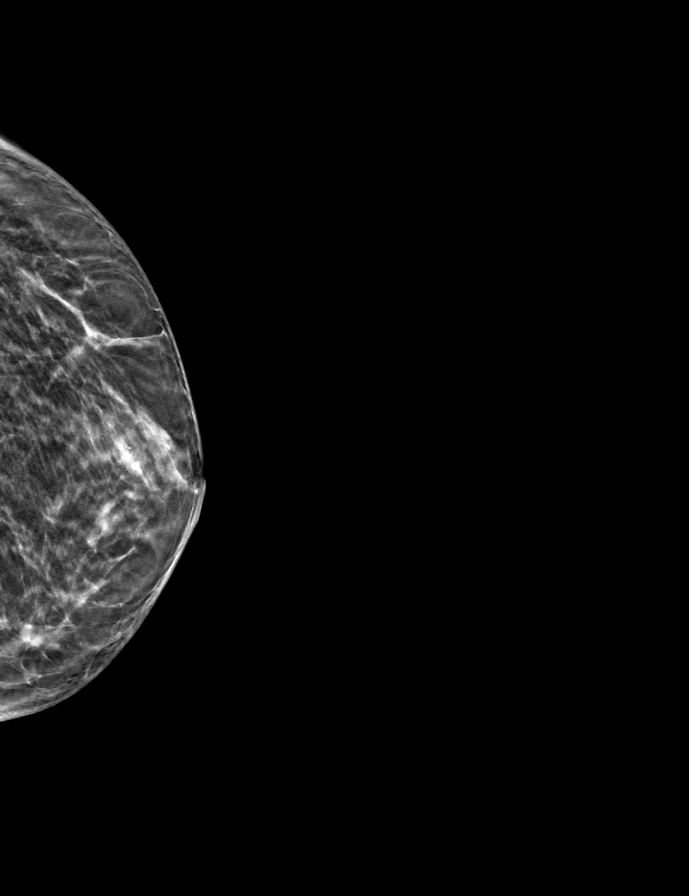
[frame 20/39]
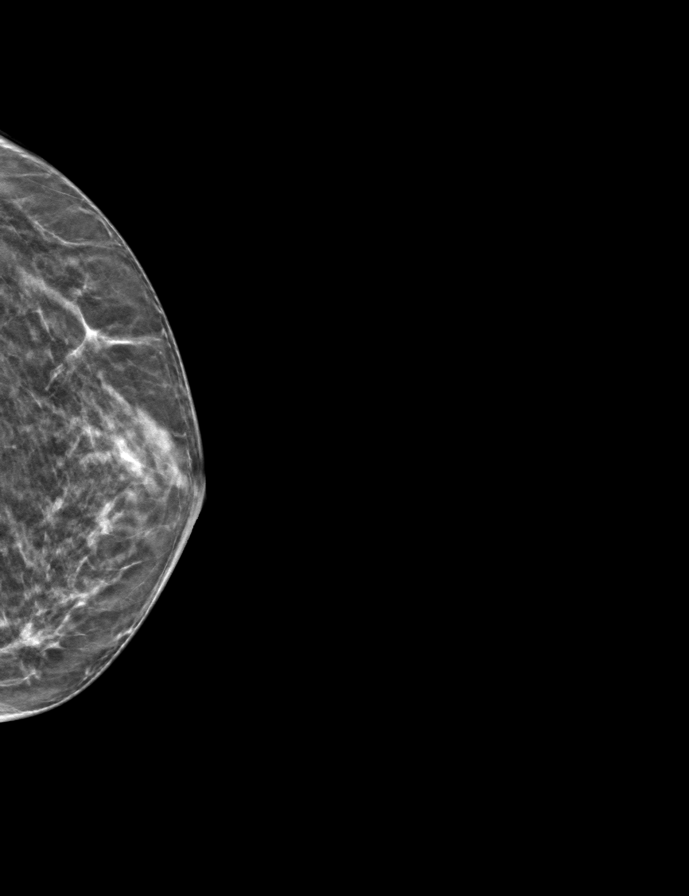

[R CC tomo · tomo slice 23/45.0]
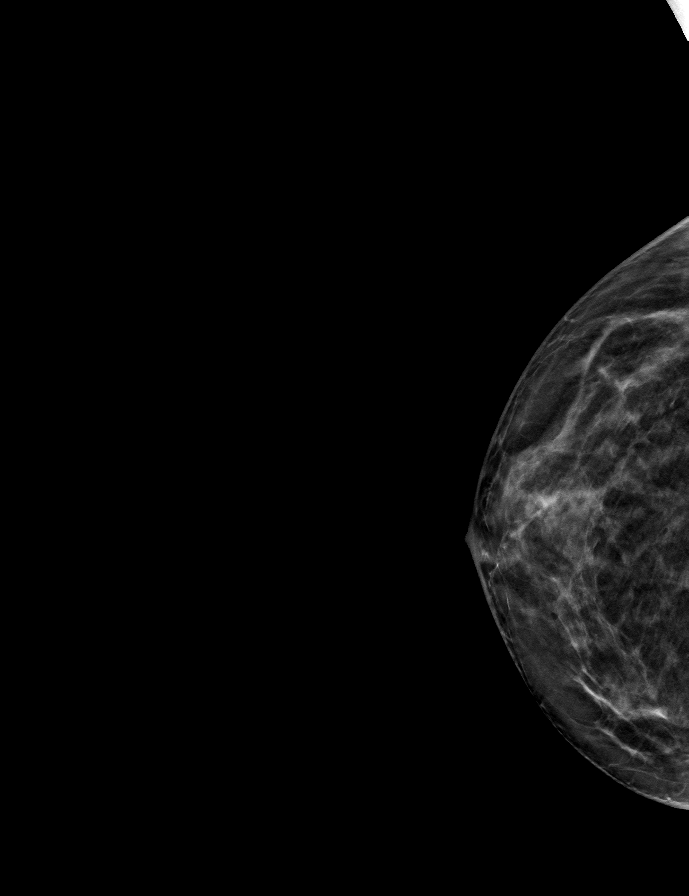

[R MLO tomo · tomo slice 21/41.0]
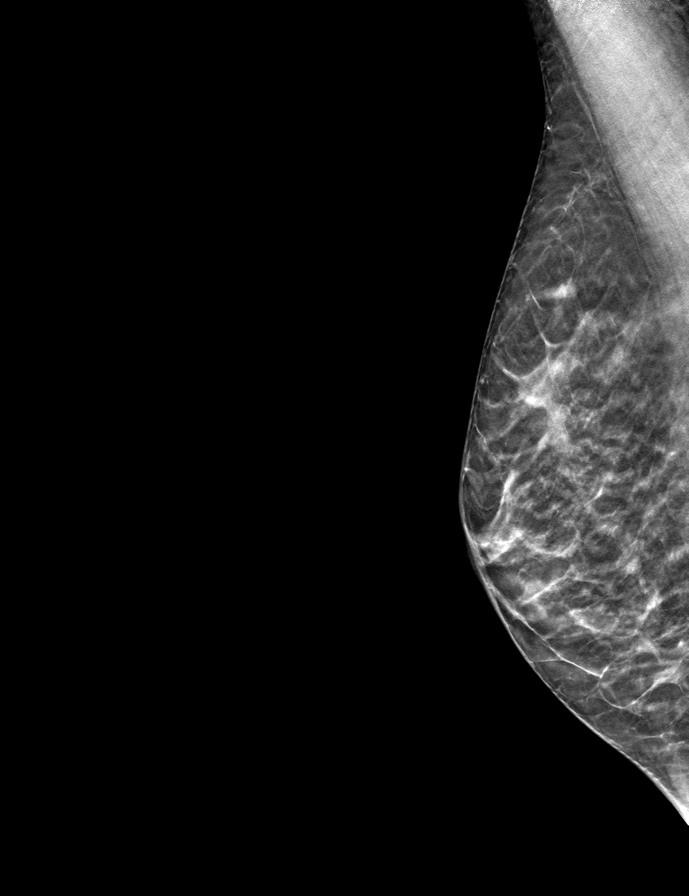

[L MLO tomo · tomo slice 21/40.0]
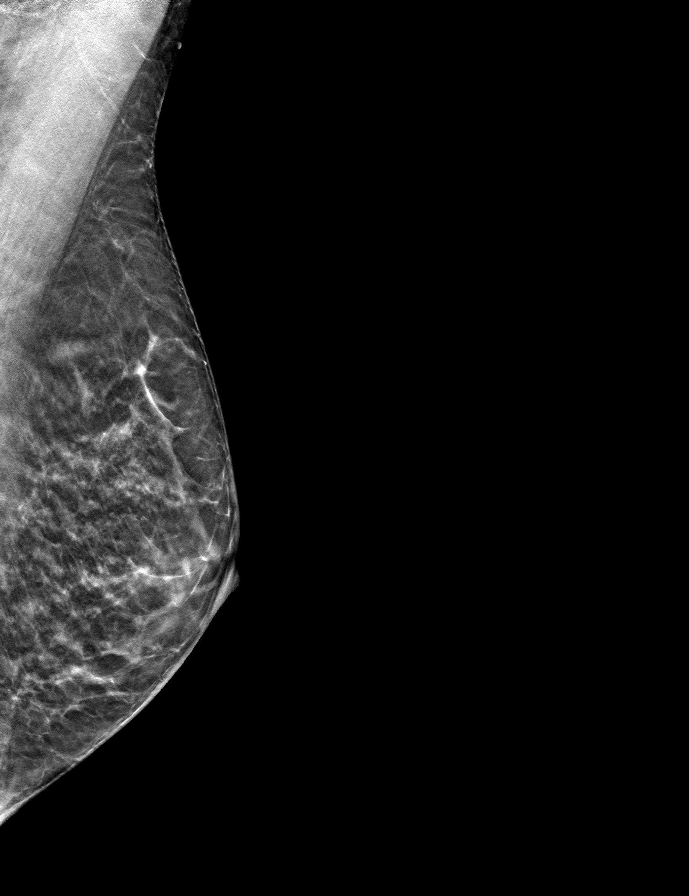

[9 of 24 positions shown; findings below may reference images not displayed]

ACR Breast Density Category c: The breast tissue is heterogeneously
dense, which may obscure small masses.
FINDINGS: There are no findings suspicious for malignancy.
IMPRESSION: No mammographic evidence of malignancy. A result letter of this
screening mammogram will be mailed directly to the patient.

RECOMMENDATION:
Screening mammogram in one year. (Code:[id number])

BI-RADS CATEGORY  1: Negative.

## 2023-06-08 ENCOUNTER — Other Ambulatory Visit: Payer: Self-pay | Admitting: Internal Medicine

## 2023-06-08 DIAGNOSIS — Z1231 Encounter for screening mammogram for malignant neoplasm of breast: Secondary | ICD-10-CM

## 2023-07-14 ENCOUNTER — Ambulatory Visit
Admission: RE | Admit: 2023-07-14 | Discharge: 2023-07-14 | Disposition: A | Discharge status: Home / Self Care | Payer: Medicare Other | Source: Ambulatory Visit | Attending: Internal Medicine | Admitting: Internal Medicine

## 2023-07-14 DIAGNOSIS — Z1231 Encounter for screening mammogram for malignant neoplasm of breast: Secondary | ICD-10-CM | POA: Insufficient documentation

## 2024-01-01 ENCOUNTER — Ambulatory Visit (INDEPENDENT_AMBULATORY_CARE_PROVIDER_SITE_OTHER): Admitting: Podiatry

## 2024-01-01 ENCOUNTER — Encounter: Payer: Self-pay | Admitting: Podiatry

## 2024-01-01 VITALS — Ht 67.0 in | Wt 126.4 lb

## 2024-01-01 DIAGNOSIS — M2141 Flat foot [pes planus] (acquired), right foot: Secondary | ICD-10-CM

## 2024-01-01 DIAGNOSIS — Q667 Congenital pes cavus, unspecified foot: Secondary | ICD-10-CM

## 2024-01-01 DIAGNOSIS — M25872 Other specified joint disorders, left ankle and foot: Secondary | ICD-10-CM | POA: Diagnosis not present

## 2024-01-01 DIAGNOSIS — M25871 Other specified joint disorders, right ankle and foot: Secondary | ICD-10-CM

## 2024-01-01 DIAGNOSIS — M2142 Flat foot [pes planus] (acquired), left foot: Secondary | ICD-10-CM

## 2024-01-01 NOTE — Progress Notes (Signed)
   Chief Complaint  Patient presents with   Foot Orthotics    Pt is here to evaluated in order to get new orthotics.    HPI: 68 y.o. female presenting today for evaluation of diffuse generalized achiness and pain especially at the end of the day.  She traditionally wears orthotics and they have helped significantly in the past.  Last seen in the office 08/19/2022.  Past Medical History:  Diagnosis Date   Amenorrhea    BCC (basal cell carcinoma), leg    Cancer (HCC)    skin   Cataract    Osteoarthritis of hands, bilateral    Ovarian cyst    Raynaud disease    Tendonitis    Varicose veins of left lower extremity     Past Surgical History:  Procedure Laterality Date   BARTHOLIN CYST MARSUPIALIZATION  12/27/2014   BASAL CELL CARCINOMA EXCISION  01/30/14, 09/28/14, 04/01/18, 01/01/17   Left Side of Face, Two on Back, Left just above upper Lip   CATARACT EXTRACTION  12/12/13, 01/30/14   Left Eye and Right Eye with implants   EXCISION NEUROMA     EYE SURGERY     FINGER SURGERY     Surgical release of adhesion of right little finger   hemithyroidectomy     HERNIA REPAIR     morton's neuroma/ neurectomy Left 02/06/2009   RIGHT HEART CATH  11/07/2021   SCLEROTHERAPY     varicose veins left and right   THYROID LOBECTOMY     VEIN SURGERY     Left and Right legs   WISDOM TOOTH EXTRACTION      Allergies  Allergen Reactions   Penicillins Anaphylaxis   Hydroxychloroquine Other (See Comments)     Physical Exam: General: The patient is alert and oriented x3 in no acute distress.  Dermatology: Skin is warm, dry and supple bilateral lower extremities.   Vascular: Palpable pedal pulses bilaterally. Capillary refill within normal limits.  No appreciable edema.  No erythema.  Neurological: Grossly intact via light touch  Musculoskeletal Exam: No pedal deformities noted. Chronic generalized achiness and pain noted which progresses throughout the day.  There is also some tenderness  throughout palpation of the plantar fascia bilateral  Radiographic exam B/L feet 08/19/2022: Bipartite sesamoids noted bilateral.  Normal osseous mineralization.  No spurring or advanced degenerative changes throughout the midfoot.  Assessment/Plan of Care: 1.  History of plantar fasciitis bilateral 2.  History of sesamoiditis right foot 3.  Diffuse generalized foot pain bilateral  -Patient evaluated.   -Patient would benefit from custom molded orthotics to support the medial longitudinal arch of the foot and equally distribute pressure during walking.  She lives to remain active - Appointment made today to be fitted for custom orthotics by our orthotics department -Return to clinic orthotics pickup       Thresa EMERSON Sar, DPM [facility]  Dr. Thresa EMERSON Sar, DPM    [address] [place], [place] [address]                Office [phone]  Fax [phone]

## 2024-01-01 NOTE — Progress Notes (Signed)
 Orthotics   Patient was present and evaluated for Custom molded foot orthotics. Patient will benefit from CFO's to provide total contact to BIL MLA's helping to balance and distribute body weight more evenly across BIL feet helping to reduce plantar pressure and pain. Orthotic will also encourage FF / RF alignment  Patient was scanned today and will return for fitting upon receipt

## 2024-01-29 ENCOUNTER — Ambulatory Visit

## 2024-02-01 NOTE — Progress Notes (Signed)
 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   Orthotics were dispensed and fit was satisfactory. Reviewed instructions for break-in and wear. Written instructions given to patient.  Patient will follow up as needed.  Mia Cruz Cped

## 2024-04-19 ENCOUNTER — Encounter: Admitting: Rehabilitative and Restorative Service Providers"

## 2024-04-20 ENCOUNTER — Encounter: Admitting: Rehabilitative and Restorative Service Providers"

## 2024-04-27 NOTE — Therapy (Incomplete)
 OUTPATIENT OCCUPATIONAL THERAPY ORTHO EVALUATION  Patient Name: Mia Cruz MRN: [id number] DOB:1956/02/26, 68 y.o., female Today's Date: 04/28/2024  PCP: Fernande FURY MD REFERRING PROVIDER:  Harry Lenis, MD    END OF SESSION:  OT End of Session - 04/28/24 1434     Visit Number 1    Number of Visits 9    Date for Recertification  06/24/24    Authorization Type Medicare    OT Start Time 1434    OT Stop Time 1535    OT Time Calculation (min) 61 min    Equipment Utilized During Treatment Orthotic materials, compression sleeves    Activity Tolerance No increased pain;Patient limited by fatigue;Patient tolerated treatment well    Behavior During Therapy [facility] for tasks assessed/performed          Past Medical History:  Diagnosis Date   Amenorrhea    BCC (basal cell carcinoma), leg    Cancer (HCC)    skin   Cataract    Osteoarthritis of hands, bilateral    Ovarian cyst    Raynaud disease    Tendonitis    Varicose veins of left lower extremity    Past Surgical History:  Procedure Laterality Date   BARTHOLIN CYST MARSUPIALIZATION  12/27/2014   BASAL CELL CARCINOMA EXCISION  01/30/14, 09/28/14, 04/01/18, 01/01/17   Left Side of Face, Two on Back, Left just above upper Lip   CATARACT EXTRACTION  12/12/13, 01/30/14   Left Eye and Right Eye with implants   EXCISION NEUROMA     EYE SURGERY     FINGER SURGERY     Surgical release of adhesion of right little finger   hemithyroidectomy     HERNIA REPAIR     morton's neuroma/ neurectomy Left 02/06/2009   RIGHT HEART CATH  11/07/2021   SCLEROTHERAPY     varicose veins left and right   THYROID LOBECTOMY     VEIN SURGERY     Left and Right legs   WISDOM TOOTH EXTRACTION     Patient Active Problem List   Diagnosis Date Noted   Palpitations 06/26/2021   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   Primary osteoarthritis involving multiple joints 03/29/2020   Adenomatous rectal polyp 03/19/2020   Urinary  urgency 03/19/2020   Raynaud's disease without gangrene 03/19/2020   H/O partial thyroidectomy 03/19/2020   Encounter for hepatitis C screening test for low risk patient 03/19/2020   Varicose veins of leg with pain, left 03/19/2020   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 She states she attended 1 therapy visit with [facility] where they made her an orthosis and started her on therapy exercises.  She has been doing well since then, but the orthosis is rubbing in several places and she would like it adjusted.  She decided to switch to a closer therapy center near her home.  She states some shooting nerve pain at times near her scar radially.  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  PRECAUTIONS: None  RED FLAGS: None   WEIGHT BEARING RESTRICTIONS: Yes no significant weightbearing for approximately 4-6 weeks  PAIN:  Are you having pain? Yes: NPRS scale: 0/10 at rest, 5-6/10 at worst in past week with infrequent jolting nerve pain Pain location: Rt thumb Pain description: Soreness Aggravating factors: Any attempted weightbearing Relieving factors: Rest  FALLS: Has patient fallen in last 6 months? No  LIVING ENVIRONMENT: Lives with: lives with their spouse Lives  in: House/apartment Has following equipment at home: None  PLOF: Independent  PATIENT GOALS: To safely improve the use of her right thumb and hand for daily functional tasks  NEXT MD VISIT: In approximately 4 weeks.   OBJECTIVE: (All objective assessments below are from initial evaluation on: 04/28/24 unless otherwise specified.)   HAND DOMINANCE: Right   ADLs: Overall ADLs: States decreased ability to grab, hold household objects, pain and difficulty to open containers, perform FMS tasks (manipulate fasteners on clothing), mild to moderate bathing problems as well.    FUNCTIONAL OUTCOME MEASURES: Eval: Patient Specific Functional Scale: 1.3 (Dressing, writing, lifting with right hand, zippers, cooking and dishes)  (Higher Score  =  Better Ability for the Selected Tasks)     UPPER EXTREMITY ROM     Shoulder to Wrist AROM Rt eval  Forearm supination 85  Forearm pronation  85  Wrist flexion 44  Wrist extension 44  (Blank rows = not tested)   Hand AROM Rt Eval  Full Fist Ability (or Gap to Distal Palmar Crease) 3.4cm gap from tip of MF to [facility]  Thumb Opposition  (Kapandji Scale)  8/10  Thumb MCP (0-60) 0-22  Thumb IP (0-80) 0-52  Thumb Radial Abduction Span NT  Thumb Palmar Abduction Span 42  (Blank rows = not tested)   UPPER EXTREMITY MMT:    Eval:  NT at eval due to recent and still healing injuries. Will be tested when appropriate.   MMT Rt TBD  Elbow flexion   Elbow extension   Forearm supination   Forearm pronation   Wrist flexion   Wrist extension   Wrist ulnar deviation   Wrist radial deviation   (Blank rows = not tested)  HAND FUNCTION: Eval: Observed weakness in affected Rt hand.  Details TBD when safe  Grip strength Right: TBD lbs, Left: TBD lbs   COORDINATION: Eval: Observed coordination impairments with affected Rt hand. Details TBD when safe  9 Hole Peg Test Right: TBDsec (TBD sec is WFL)   SENSATION: Eval:  Light touch intact today,   though diminished around sx area    EDEMA:   Eval:  Mildly swollen in Rt hand and wrist today, compared to the left hand and wrist  COGNITION: Eval: Overall cognitive status: WFL for evaluation today   OBSERVATIONS:   Eval: Swelling and tenderness within normal limits for postop timeframe, no signs of infection or dehiscence.  She seems to have been tolerating light thumb and CMC joint motion already.  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   Rt thumb CMC joint arthroplasty  TODAY'S TREATMENT:  Post-evaluation treatment:   Because her current orthosis is causing some irritation, OT modifies it by adding padding, trimming down and  rolling away a sharp edges, etc.  OT also supplies her with compressive Tubigrip sleeve to wear beneath to help with swelling.  She is also supplied with a compressive finger sock for her thumb to help with swelling.  While wearing the orthosis with new compressive materials, she states it fits much better and now does not cause any irritation.  For safety/self-care, the patient was recommended to do no pushing/pulling or weightbearing through the Rt hand or arm now.  The patient was taught to care for the postsurgical wound by doing light touch desensitization, gentle scar mobilizations, keep moist with a Vaseline once Steri-Strips fall off.  The patient should not be soaking the wound, either.  The patient can quickly shower and dab dry.  The patient was given a compressive stockinette to help with swelling.  The patient was also given the following home exercise program to perform in a nonpainful fashion approximately 4 times a day.  Each one was reviewed with the patient, with performance back to show understanding and no significant pain.  Additionally, OT gives recommendations and exercises for boutonniere deformities and small  finger flexion contracture.  She tolerates exercises well for this.  This is necessary as the flexors and extensors of the hand are intimately linked and will affect motion and ability after this surgery.  The patient leaves without any questions or concerns.  Exercises reviewed and performed today: - Standing Radial Nerve Glide  - 4-5 x daily - 1 sets - 5-10 reps - Reach arms upward   - 4 x daily - 10 reps - Turn [employer] Facing Up & Down  - 4-6 x daily - 10-15 reps - Bend and Pull Back Wrist SLOWLY  - 4 x daily - 10-15 reps - Tendon Glides  - 4-6 x daily - 3-5 reps - 2-3 seconds hold - Thumb AROM IP Blocking  - 4-6 x daily - 10-15 reps - Thumb Opposition  - 4-6 x daily - 10 reps - Seated Composite Thumb Flexion AROM  - 4-5 x daily - 1 sets - 10-15 reps - PUSH KNUCKLES DOWN  - 4 x daily - 5 reps - 15 seconds hold - Hand PROM DIP Flexion   - 4-5 x daily - 5 reps - 15-20 sec hold - Tip Joint Blocking Motion  - 4-6 x daily - 10-15 reps Patient Education - Scar Massage   PATIENT EDUCATION: Education details: See tx section above for details  Person educated: Patient Education method: Verbal Instruction, Teach back, Handouts  Education comprehension: States and demonstrates understanding, Additional Education required    HOME EXERCISE PROGRAM: Access Code: [email or web address] URL: [email or web address] Date: 04/28/2024 Prepared by: Melvenia Ada   GOALS: Goals reviewed with patient? Yes   SHORT TERM GOALS: (STG required if POC>30 days) Target Date: 05/13/24  Pt will obtain protective, custom orthotic. Goal status: 04/28/24 MET   2.  Pt will demo/state understanding of initial HEP to improve pain levels and prerequisite motion. Goal status: INITIAL   LONG TERM GOALS: Target Date: 06/24/2024  Pt will improve functional ability by decreased impairment per PSFS assessment from 1.3 to 6.5 or better, for better quality of life. Goal status: INITIAL  2.  Pt will improve grip  strength in right hand from unsafe to test to at least 20 lbs for functional use at home and in IADLs. Goal status: INITIAL  3.  Pt will improve A/ROM in right thumb composite flexion from 74 degrees to at least 90 degrees, to have  functional motion for tasks like reach and grasp.  Goal status: INITIAL  4.  Pt will improve strength in right wrist flexion/extension from apparent 3 -/5 MMT to at least 4+/5 MMT to have increased functional ability to carry out selfcare and higher-level homecare tasks with less difficulty. Goal status: INITIAL  5.  Pt will improve coordination skills in right hand and arm, as seen by within functional limit score on nine-hole peg testing to have increased functional ability to carry out fine motor tasks (fasteners, etc.) and more complex, coordinated IADLs (meal prep, sports, etc.).  Goal status: INITIAL  6.  Pt will decrease pain at worst from 5/10 to 2/10 or better to have better sleep and occupational participation in daily roles. Goal status: INITIAL   ASSESSMENT:  CLINICAL IMPRESSION: Patient is a 68 y.o. female who was seen today for occupational therapy evaluation for stiffness, weakness, swelling, decreased coordination and functional ability with the right thumb, hand and arm after chronic arthritis and recent [facility] joint arthroplasty.  The patient will benefit from outpatient occupational therapy to decrease symptoms, improve functional upper extremity use, and increase quality of life.  PERFORMANCE DEFICITS: in functional skills including ADLs, IADLs, coordination, dexterity, sensation, edema, ROM, strength, pain, fascial restrictions, flexibility, Fine motor control, body mechanics, endurance, decreased knowledge of precautions, wound, and UE functional use, cognitive skills including problem solving and safety awareness, and psychosocial skills including coping strategies, environmental adaptation, habits, and routines and behaviors.   IMPAIRMENTS: are  limiting patient from ADLs, IADLs, rest and sleep, and leisure.   COMORBIDITIES: may have co-morbidities  that affects occupational performance. Patient will benefit from skilled OT to address above impairments and improve overall function.  MODIFICATION OR ASSISTANCE TO COMPLETE EVALUATION: No modification of tasks or assist necessary to complete an evaluation.  OT OCCUPATIONAL PROFILE AND HISTORY: Problem focused assessment: Including review of records relating to presenting problem.  CLINICAL DECISION MAKING: Moderate - several treatment options, min-mod task modification necessary  REHAB POTENTIAL: Excellent  EVALUATION COMPLEXITY: Low      PLAN:  OT FREQUENCY: 1-2x/week  OT DURATION: 8 weeks through 06/24/24 and up to 10 total visits as needed   PLANNED INTERVENTIONS: 97535 self care/ADL training, 02889 therapeutic exercise, 97530 therapeutic activity, 97112 neuromuscular re-education, 97140 manual therapy, 97035 ultrasound, 97032 electrical stimulation (manual), 97760 Orthotic Initial, [id number] Orthotic/Prosthetic subsequent, compression bandaging, Dry needling, energy conservation, coping strategies training, and patient/family education  RECOMMENDED OTHER SERVICES: none now    CONSULTED AND AGREED WITH PLAN OF CARE: Patient  PLAN FOR NEXT SESSION:   Review initial HEP and recommendations, check orthosis and upgrade to 3 weeks postop   Melvenia Ada, OTR/L, CHT  04/28/2024, 4:04 PM

## 2024-04-28 ENCOUNTER — Ambulatory Visit (INDEPENDENT_AMBULATORY_CARE_PROVIDER_SITE_OTHER): Admitting: Rehabilitative and Restorative Service Providers"

## 2024-04-28 ENCOUNTER — Encounter: Payer: Self-pay | Admitting: Rehabilitative and Restorative Service Providers"

## 2024-04-28 DIAGNOSIS — M6281 Muscle weakness (generalized): Secondary | ICD-10-CM | POA: Diagnosis not present

## 2024-04-28 DIAGNOSIS — R278 Other lack of coordination: Secondary | ICD-10-CM

## 2024-04-28 DIAGNOSIS — M25631 Stiffness of right wrist, not elsewhere classified: Secondary | ICD-10-CM

## 2024-04-28 DIAGNOSIS — M79641 Pain in right hand: Secondary | ICD-10-CM | POA: Diagnosis not present

## 2024-04-28 DIAGNOSIS — R600 Localized edema: Secondary | ICD-10-CM | POA: Diagnosis not present

## 2024-04-28 DIAGNOSIS — M25641 Stiffness of right hand, not elsewhere classified: Secondary | ICD-10-CM

## 2024-05-02 ENCOUNTER — Telehealth: Payer: Self-pay | Admitting: Rehabilitative and Restorative Service Providers"

## 2024-05-02 NOTE — Telephone Encounter (Signed)
 Called pt back per her request. She'd like to cx our next apt so she can o [facility] to see an OT for a new hand brace. She was warned that insurance does not typically cover OT at 2 separate facilities. She'd like to keep her other apts.

## 2024-05-18 ENCOUNTER — Encounter: Admitting: Rehabilitative and Restorative Service Providers"

## 2024-05-27 ENCOUNTER — Encounter: Admitting: Rehabilitative and Restorative Service Providers"

## 2024-05-31 ENCOUNTER — Encounter: Admitting: Rehabilitative and Restorative Service Providers"

## 2024-06-07 ENCOUNTER — Encounter: Admitting: Rehabilitative and Restorative Service Providers"

## 2024-06-14 ENCOUNTER — Encounter: Admitting: Rehabilitative and Restorative Service Providers"

## 2024-06-21 ENCOUNTER — Encounter: Admitting: Rehabilitative and Restorative Service Providers"
